# Patient Record
Sex: Female | Born: 1940 | Race: Black or African American | Hispanic: No | Marital: Married | Smoking: Never smoker
Health system: Southern US, Community
[De-identification: ages and names within clinical notes are randomized; demographics above are authoritative.]

---

## 2019-11-02 ENCOUNTER — Encounter: Payer: Self-pay | Admitting: Endocrinology

## 2020-03-17 ENCOUNTER — Encounter: Payer: Self-pay | Admitting: Endocrinology

## 2020-03-20 ENCOUNTER — Other Ambulatory Visit: Payer: Self-pay

## 2020-04-11 ENCOUNTER — Encounter: Payer: Self-pay | Admitting: Endocrinology

## 2020-04-11 ENCOUNTER — Other Ambulatory Visit: Payer: Self-pay

## 2020-04-11 ENCOUNTER — Ambulatory Visit (INDEPENDENT_AMBULATORY_CARE_PROVIDER_SITE_OTHER): Payer: Medicare Other | Admitting: Endocrinology

## 2020-04-11 VITALS — BP 142/80 | HR 79 | Ht <= 58 in | Wt 234.8 lb

## 2020-04-11 DIAGNOSIS — E669 Obesity, unspecified: Secondary | ICD-10-CM | POA: Diagnosis not present

## 2020-04-11 DIAGNOSIS — E1169 Type 2 diabetes mellitus with other specified complication: Secondary | ICD-10-CM | POA: Insufficient documentation

## 2020-04-11 DIAGNOSIS — E0590 Thyrotoxicosis, unspecified without thyrotoxic crisis or storm: Secondary | ICD-10-CM | POA: Diagnosis not present

## 2020-04-11 DIAGNOSIS — E042 Nontoxic multinodular goiter: Secondary | ICD-10-CM

## 2020-04-11 LAB — T3, FREE: T3, Free: 2.7 pg/mL (ref 2.3–4.2)

## 2020-04-11 LAB — T4, FREE: Free T4: 0.72 ng/dL (ref 0.60–1.60)

## 2020-04-11 LAB — GLUCOSE, RANDOM: Glucose, Bld: 102 mg/dL — ABNORMAL HIGH (ref 70–99)

## 2020-04-11 LAB — HEMOGLOBIN A1C: Hgb A1c MFr Bld: 6.5 % (ref 4.6–6.5)

## 2020-04-11 LAB — TSH: TSH: 2.95 u[IU]/mL (ref 0.35–4.50)

## 2020-04-11 NOTE — Progress Notes (Signed)
Patient ID: Erica [name], female   DOB: Jan 07, 1941, 79 y.o.   MRN: [id number]                                                                                                              Reason for Appointment:  Hyperthyroidism, new consultation  Referring healthcare provider: Malena Edman, NP   Chief complaint: Tiredness   History of Present Illness:   She says she has been diagnosed to have an overactive thyroid over 10 years ago Initially she had symptoms of palpitations, feeling moody, feeling excessively warm and sweaty, and fatigue.  The patient had lost unknown amount of weight also  He has been treated with methimazole in variable doses, presumably over 20 mg at 1 time For some time he has been taking only 5 mg daily She was followed by an endocrinologist at [facility] and was last seen there in 04/2019 and at that time was taking 10 mg methimazole  Apparently at that time her TSH had gone up to 10.1 and she was told to reduce the dose to 5 mg but did not have regular follow-up subsequently until she was seen by her PCP earlier this month  Currently she does feel tired and this may be somewhat worse in the last 3 months No further complaints of palpitations She thinks she has lost some weight recently, maximum weight 252 pounds Does not feel hot, tends to feel more cold but not consistently No dry skin or unusual constipation She does not feel drowsy or have difficulty concentrating   Wt Readings from Last 3 Encounters:  04/11/20 234 lb 12.8 oz (106.5 kg)    Thyroid function tests as follows:     TSH done on 03/17/2020 was 4.65, free T4 0.85 and total T3 98  No results found for: FREET4, T3FREE, TSH  No results found for: THYROTRECAB   Previous thyrotropin receptor antibody done in 11/2017 was 19.6 through [facility]  Evaluation done at [facility] in 2019:  Thyroid nodules: thyroid US confirmed the presence of 3 very large nodules (2xRt and 1xLT) which all meet criteria for  Bx. NM RIU with >60% uptake and cold nodules. FNA with benign results   Allergies as of 04/11/2020   No Known Allergies     Medication List       Accurate as of April 11, 2020  3:49 PM. If you have any questions, ask your nurse or doctor.        albuterol 108 (90 Base) MCG/ACT inhaler Commonly known as: VENTOLIN HFA Inhale 2 puffs into the lungs 4 (four) times daily as needed for shortness of breath. Inhale up to 4 times daily as needed.   albuterol (2.5 MG/3ML) 0.083% nebulizer solution Commonly known as: PROVENTIL Inhale 3 mLs into the lungs 3 (three) times daily as needed for shortness of breath. Inhale 23mL via nebulizer up to three times daily as needed.   aspirin 81 MG EC tablet Take 1 tablet by mouth daily.   diclofenac Sodium 1 % Gel Commonly known as:  VOLTAREN Apply 1 application topically 4 (four) times daily as needed for pain. Apply one application topically up to four times daily for knee pain.   docusate sodium 100 MG capsule Commonly known as: COLACE Take 100 mg by mouth daily.   dorzolamide 2 % ophthalmic solution Commonly known as: TRUSOPT Place 1 drop into the right eye 2 (two) times daily. Instill 1 drop in right eye 2 times daily.   latanoprost 0.005 % ophthalmic solution Commonly known as: XALATAN Place 1 drop into both eyes at bedtime.   Magnesium Oxide 250 MG Tabs Take 250 mg by mouth daily.   metFORMIN 1000 MG tablet Commonly known as: GLUCOPHAGE Take 1,000 mg by mouth daily.   methimazole 5 MG tablet Commonly known as: TAPAZOLE Take 5 mg by mouth daily.   metoprolol succinate 25 MG 24 hr tablet Commonly known as: TOPROL-XL Take 25 mg by mouth daily.   triamterene-hydrochlorothiazide 37.5-25 MG tablet Commonly known as: MAXZIDE-25 Take 1 tablet by mouth daily.           History reviewed. No pertinent past medical history.  History reviewed. No pertinent surgical history.  Family History  Problem Relation Age of Onset  .  Diabetes Father   . Diabetes Sister   . Diabetes Brother   . Thyroid disease Neg Hx     Social History:  has no history on file for tobacco, alcohol, and drug.  Allergies: No Known Allergies   Review of Systems  Constitutional: Positive for weight loss and malaise.  HENT:       She thinks occasionally her voice gets deep but not necessarily hoarse  Eyes: Negative for blurred vision.  Respiratory: Negative for shortness of breath.   Cardiovascular: Negative for palpitations and leg swelling.  Gastrointestinal: Positive for constipation.  Endocrine: Positive for cold intolerance.       Last A1c 6.4 in 11/20, has had diabetes for 15-20 years currently on Metformin only.  Home blood sugars about 120 checked randomly  Genitourinary: Negative for dysuria.  Musculoskeletal: Positive for joint pain.  Skin: Negative for dry skin.  Neurological: Positive for tingling.       Examination:   BP (!) 142/80 (BP Location: Left Arm, Patient Position: Sitting, Cuff Size: Normal)   Pulse 79   Ht 1' (0.305 m)   Wt 234 lb 12.8 oz (106.5 kg)   SpO2 98%   BMI 1146.41 kg/m    General Appearance:  well-built and nourished, pleasant, not anxious or hyperkinetic.        Eyes: No abnormal prominence, lid lag or stare present.  No swelling of the eyelids   Neck: The thyroid is enlarged at least 3 times normal, larger on the left Left lobe is firmer about 3-3-1/2 times normal and nodular Right lobe is better felt on swallowing and is about 3 times normal No significant isthmus enlargement  There is no lymphadenopathy in the neck .           Heart: normal S1 and S2, no murmurs .          Lungs: breath sounds are normal bilaterally without added sounds  Abdomen: no hepatosplenomegaly or other palpable abnormality   Extremities:  Normal appearance, hands not diaphoretic or unusually warm No ankle edema.  Neurological:  No fine tremors are present. Deep tendon reflexes at biceps are  slightly brisk.  Skin: No rash, abnormal thickening of the skin on the lower legs seen     Assessment/Plan:  Hyperthyroidism, apparently Graves' disease as confirmed by high thyrotropin receptor antibody in 2018 She has had hypothyroidism for over 10 years apparently  She also has a multinodular goiter on exam  Apparently she required lower doses of methimazole at least for the last year and now only on 5 mg daily She has some nonspecific fatigue and weight loss Most recent TSH was high at 4.7  This likely indicates that she may be in remission from the Graves' disease and may not need further antithyroid drugs   Discussed with the patient the hyperthyroidism is a result of an autoimmune condition affecting the thyroid cells although she may have a component of autonomous or overactive thyroid nodules also  She does have a significant multinodular goiter on exam This has been also evaluated with needle aspiration biopsies for the cold nodules and is benign  Explained that the options for treatment are either antithyroid drugs and radioactive iodine. Radioactive iodine would be preferred if she continues to have hyperthyroidism without increased thyrotropin receptor antibody  Most likely will need to stop her methimazole but will recheck her thyroid levels again today since she may have missed a few doses prior to her last labs  Patient handout on hyperthyroidism and radioactive iodine treatment given Patient understands the above discussion and treatment options. 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 Any transcriptional errors that result from this process are unintentional.

## 2020-04-12 ENCOUNTER — Other Ambulatory Visit: Payer: Self-pay | Admitting: Endocrinology

## 2020-04-12 DIAGNOSIS — E0590 Thyrotoxicosis, unspecified without thyrotoxic crisis or storm: Secondary | ICD-10-CM

## 2020-04-12 LAB — THYROTROPIN RECEPTOR AUTOABS: Thyrotropin Receptor Ab: 21.7 IU/L — ABNORMAL HIGH (ref 0.00–1.75)

## 2020-04-12 NOTE — Progress Notes (Signed)
Thyroid level is normal but antibody test for Graves' disease is high.  Instead of continuing methimazole will need to do radioactive iodine treatment.  She will first need to have a test dose which will take 2 separate trips.  She will stop methimazole 7 days before the test.  Does she want to do this in [place]?

## 2020-04-27 ENCOUNTER — Encounter (HOSPITAL_COMMUNITY)
Admission: RE | Admit: 2020-04-27 | Discharge: 2020-04-27 | Disposition: A | Discharge status: Home / Self Care | Payer: Medicare Other | Source: Ambulatory Visit | Attending: Endocrinology | Admitting: Endocrinology

## 2020-04-27 ENCOUNTER — Other Ambulatory Visit: Payer: Self-pay

## 2020-04-27 DIAGNOSIS — E0590 Thyrotoxicosis, unspecified without thyrotoxic crisis or storm: Secondary | ICD-10-CM

## 2020-04-27 MED ORDER — SODIUM IODIDE I-123 7.4 MBQ CAPS
464.0000 | ORAL_CAPSULE | Freq: Once | ORAL | Status: AC
  Administered 2020-04-27: 464 via ORAL

## 2020-04-28 ENCOUNTER — Encounter (HOSPITAL_COMMUNITY)
Admission: RE | Admit: 2020-04-28 | Discharge: 2020-04-28 | Disposition: A | Discharge status: Home / Self Care | Payer: Medicare Other | Source: Ambulatory Visit | Attending: Endocrinology | Admitting: Endocrinology

## 2020-05-01 ENCOUNTER — Other Ambulatory Visit: Payer: Self-pay | Admitting: Endocrinology

## 2020-05-01 DIAGNOSIS — E0590 Thyrotoxicosis, unspecified without thyrotoxic crisis or storm: Secondary | ICD-10-CM

## 2020-05-03 NOTE — Progress Notes (Signed)
Scheduled for 05-26-20 at 1 but Dr Lucianne Muss wants it earlier.   I called NM and she is going to call me back with a earlier appt.

## 2020-05-17 ENCOUNTER — Ambulatory Visit: Payer: Medicare Other | Admitting: Endocrinology

## 2020-05-19 ENCOUNTER — Encounter (HOSPITAL_COMMUNITY): Admission: RE | Admit: 2020-05-19 | Payer: Medicare Other | Source: Ambulatory Visit

## 2020-05-26 ENCOUNTER — Ambulatory Visit (HOSPITAL_COMMUNITY): Payer: Medicare Other

## 2020-05-26 ENCOUNTER — Other Ambulatory Visit: Payer: Self-pay

## 2020-05-26 ENCOUNTER — Ambulatory Visit (HOSPITAL_COMMUNITY)
Admission: RE | Admit: 2020-05-26 | Discharge: 2020-05-26 | Disposition: A | Discharge status: Home / Self Care | Payer: Medicare Other | Source: Ambulatory Visit | Attending: Endocrinology | Admitting: Endocrinology

## 2020-05-26 DIAGNOSIS — E0590 Thyrotoxicosis, unspecified without thyrotoxic crisis or storm: Secondary | ICD-10-CM | POA: Diagnosis not present

## 2020-05-26 MED ORDER — SODIUM IODIDE I 131 CAPSULE
20.7000 | Freq: Once | INTRAVENOUS | Status: AC | PRN
  Administered 2020-05-26: 20.7 via ORAL

## 2020-06-15 ENCOUNTER — Ambulatory Visit (INDEPENDENT_AMBULATORY_CARE_PROVIDER_SITE_OTHER): Payer: Medicare Other | Admitting: Endocrinology

## 2020-06-15 ENCOUNTER — Other Ambulatory Visit: Payer: Self-pay

## 2020-06-15 VITALS — BP 122/72 | HR 84 | Ht 65.0 in | Wt 234.2 lb

## 2020-06-15 DIAGNOSIS — E669 Obesity, unspecified: Secondary | ICD-10-CM

## 2020-06-15 DIAGNOSIS — E042 Nontoxic multinodular goiter: Secondary | ICD-10-CM | POA: Diagnosis not present

## 2020-06-15 DIAGNOSIS — E1169 Type 2 diabetes mellitus with other specified complication: Secondary | ICD-10-CM | POA: Diagnosis not present

## 2020-06-15 LAB — T4, FREE: Free T4: 0.72 ng/dL (ref 0.60–1.60)

## 2020-06-15 LAB — GLUCOSE, RANDOM: Glucose, Bld: 88 mg/dL (ref 70–99)

## 2020-06-15 LAB — TSH: TSH: 4.96 u[IU]/mL — ABNORMAL HIGH (ref 0.35–4.50)

## 2020-06-15 LAB — GLUCOSE, POCT (MANUAL RESULT ENTRY): POC Glucose: 80 mg/dl (ref 70–99)

## 2020-06-15 NOTE — Patient Instructions (Signed)
Stop Mehimazole

## 2020-06-15 NOTE — Progress Notes (Signed)
Patient ID: Erica [name], female   DOB: [birth year], 79 y.o.   MRN: [id number]                                                                                                              Reason for Appointment:  Hyperthyroidism, follow-up visit  Referring healthcare provider: Malena Edman, NP   Chief complaint: Follow-up of thyroid   History of Present Illness:   Background history: She says she has been diagnosed to have an overactive thyroid over 10 years ago Initially she had symptoms of palpitations, feeling moody, feeling excessively warm and sweaty, and fatigue.  The patient had lost unknown amount of weight also  He has been treated with methimazole in variable doses, presumably over 20 mg at 1 time For some time he has been taking only 5 mg daily She was followed by an endocrinologist at [facility] and was last seen there in 04/2019 and at that time was taking 10 mg methimazole  Apparently at that time her TSH had gone up to 10.1 and she was told to reduce the dose to 5 mg but did not have regular follow-up subsequently until she was seen by her PCP   RECENT HISTORY:  On her initial consultation she felt that she may have lost some weight and was having persistent tiredness She did not have any other symptoms suggestive of hyperthyroidism  Her thyroid levels in 4/21 were normal but her thyrotropin receptor antibody was 21.7  She was treated with radioactive iodine on 05/26/2020 with 20.7 mCi I-131 sodium iodide  She thinks swelling in her neck is less prominent she is not tired all the time She sometimes feels hot but also other times feels cold She is still on methimazole 5 mg daily  Wt Readings from Last 3 Encounters:  06/15/20 234 lb 3.2 oz (106.2 kg)  04/11/20 234 lb 12.8 oz (106.5 kg)    Thyroid function tests as follows:     TSH done on 03/17/2020 was 4.65, free T4 = 0.85 and total T3 = 98  Lab Results  Component Value Date   FREET4 0.72 04/11/2020   T3FREE  2.7 04/11/2020   TSH 2.95 04/11/2020    Lab Results  Component Value Date   THYROTRECAB 21.70 (H) 04/11/2020     Previous thyrotropin receptor antibody done in 11/2017 was 19.6 through [facility]  Evaluation done at [facility] in 2019:  Thyroid nodules: thyroid US confirmed the presence of 3 very large nodules (2xRt and 1xLT) which all meet criteria for Bx. NM RIU with >60% uptake and cold nodules. FNA with benign results   Allergies as of 06/15/2020   No Known Allergies     Medication List       Accurate as of June 15, 2020  2:13 PM. If you have any questions, ask your nurse or doctor.        albuterol 108 (90 Base) MCG/ACT inhaler Commonly known as: VENTOLIN HFA Inhale 2 puffs into the lungs 4 (four) times daily as needed for shortness  of breath. Inhale up to 4 times daily as needed.   albuterol (2.5 MG/3ML) 0.083% nebulizer solution Commonly known as: PROVENTIL Inhale 3 mLs into the lungs 3 (three) times daily as needed for shortness of breath. Inhale 93mL via nebulizer up to three times daily as needed.   aspirin 81 MG EC tablet Take 1 tablet by mouth daily.   diclofenac Sodium 1 % Gel Commonly known as: VOLTAREN Apply 1 application topically 4 (four) times daily as needed for pain. Apply one application topically up to four times daily for knee pain.   docusate sodium 100 MG capsule Commonly known as: COLACE Take 100 mg by mouth daily.   dorzolamide 2 % ophthalmic solution Commonly known as: TRUSOPT Place 1 drop into the right eye 2 (two) times daily. Instill 1 drop in right eye 2 times daily.   latanoprost 0.005 % ophthalmic solution Commonly known as: XALATAN Place 1 drop into both eyes at bedtime.   Magnesium Oxide 250 MG Tabs Take 250 mg by mouth daily.   metFORMIN 1000 MG tablet Commonly known as: GLUCOPHAGE Take 1,000 mg by mouth daily.   methimazole 5 MG tablet Commonly known as: TAPAZOLE Take 5 mg by mouth daily.   metoprolol succinate 25 MG 24 hr  tablet Commonly known as: TOPROL-XL Take 25 mg by mouth daily.   triamterene-hydrochlorothiazide 37.5-25 MG tablet Commonly known as: MAXZIDE-25 Take 1 tablet by mouth daily.           No past medical history on file.  No past surgical history on file.  Family History  Problem Relation Age of Onset  . Diabetes Father   . Diabetes Sister   . Diabetes Brother   . Thyroid disease Neg Hx     Social History:  has no history on file for tobacco use, alcohol use, and drug use.  Allergies: No Known Allergies   Review of Systems  DIABETES: She has mild diabetes and she thinks her blood sugars are near normal at home in the morning  Lab Results  Component Value Date   HGBA1C 6.5 04/11/2020   No results found for: GLUF, MICROALBUR, LDLCALC, CREATININE    Examination:   BP 122/72 (BP Location: Left Arm, Patient Position: Sitting)   Pulse 84   Wt 234 lb 3.2 oz (106.2 kg)   BMI 1143.48 kg/m   THYROID exam: Thyroid is enlarged bilaterally, firm and smooth, about twice normal and slightly more prominent on the left  Neurological:  No fine tremors are present. Deep tendon reflexes at biceps are slightly brisk.  No pedal edema   Assessment/Plan:   Hyperthyroidism, 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 in 2018  She also has a multinodular goiter on exam  She was treated with I-131 and is continued on methimazole 5 mg daily Subjectively she is doing fairly well and feels less tired  On exam her goiter is significantly smaller Although her reflexes are brisk she looks euthyroid Thyroid levels will be checked today  Since most likely with her thyroid size regressing she has had [name] results from the I-131 she can stop methimazole for now  Discussed potential for hypothyroidism by the end of next month and she will come back in about 6 weeks for follow-up again   Reather Littler 06/15/2020, 2:13 PM    Note: This office note was prepared  with Dragon voice recognition system technology. Any transcriptional errors that result from this process are unintentional.

## 2020-06-15 NOTE — Addendum Note (Signed)
Addended by: Iris Pert D on: 06/15/2020 04:11 PM   Modules accepted: Orders

## 2020-06-20 ENCOUNTER — Telehealth: Payer: Self-pay | Admitting: Endocrinology

## 2020-06-20 NOTE — Telephone Encounter (Signed)
Pt called a second time stating she was asked to call this number by Anette Riedel - saw that he did attempt to call her 2x. Gave her the lab results she understood and will stop the methimazole, pt is already set up for a visit next month

## 2020-06-20 NOTE — Telephone Encounter (Signed)
Patient states she is returning Noah's call and requests to be called at ph# [phone].

## 2020-08-02 ENCOUNTER — Encounter: Payer: Self-pay | Admitting: Endocrinology

## 2020-08-02 ENCOUNTER — Ambulatory Visit (INDEPENDENT_AMBULATORY_CARE_PROVIDER_SITE_OTHER): Payer: Medicare Other | Admitting: Endocrinology

## 2020-08-02 ENCOUNTER — Other Ambulatory Visit (INDEPENDENT_AMBULATORY_CARE_PROVIDER_SITE_OTHER): Payer: Medicare Other

## 2020-08-02 ENCOUNTER — Other Ambulatory Visit: Payer: Self-pay

## 2020-08-02 VITALS — BP 140/72 | HR 76 | Ht 65.0 in | Wt 236.8 lb

## 2020-08-02 DIAGNOSIS — E669 Obesity, unspecified: Secondary | ICD-10-CM

## 2020-08-02 DIAGNOSIS — E1169 Type 2 diabetes mellitus with other specified complication: Secondary | ICD-10-CM

## 2020-08-02 DIAGNOSIS — E042 Nontoxic multinodular goiter: Secondary | ICD-10-CM | POA: Diagnosis not present

## 2020-08-02 LAB — POCT GLUCOSE (DEVICE FOR HOME USE): Glucose Fasting, POC: 85 mg/dL (ref 70–99)

## 2020-08-02 LAB — T4, FREE: Free T4: 0.77 ng/dL (ref 0.60–1.60)

## 2020-08-02 LAB — T3, FREE: T3, Free: 3.3 pg/mL (ref 2.3–4.2)

## 2020-08-02 LAB — POCT GLYCOSYLATED HEMOGLOBIN (HGB A1C): Hemoglobin A1C: 6.2 % — AB (ref 4.0–5.6)

## 2020-08-02 LAB — TSH: TSH: 1.48 u[IU]/mL (ref 0.35–4.50)

## 2020-08-02 NOTE — Progress Notes (Signed)
Patient ID: Erica [name], female   DOB: 14-May-1941, 79 y.o.   MRN: [id number]                                                                                                              Reason for Appointment:  Hyperthyroidism, follow-up visit  Referring healthcare provider: Malena Edman, NP   Chief complaint: Follow-up of thyroid   History of Present Illness:   Background history: She says she has been diagnosed to have an overactive thyroid over 10 years ago Initially she had symptoms of palpitations, feeling moody, feeling excessively warm and sweaty, and fatigue.  The patient had lost unknown amount of weight also  He has been treated with methimazole in variable doses, presumably over 20 mg at 1 time For some time he has been taking only 5 mg daily She was followed by an endocrinologist at [facility] and was last seen there in 04/2019 and at that time was taking 10 mg methimazole  Apparently at that time her TSH had gone up to 10.1 and she was told to reduce the dose to 5 mg but did not have regular follow-up subsequently until she was seen by her PCP   RECENT HISTORY:  On her initial consultation she felt that she may have lost some weight and was having persistent tiredness She did not have any other symptoms suggestive of hyperthyroidism  Her thyroid levels in 4/21 were normal but her thyrotropin receptor antibody was 21.7  She was treated with radioactive iodine on 05/26/2020 with 20.7 mCi I-131 sodium iodide  Subsequently she has had less swelling of her neck and goiter was monitored She also has been subjectively feeling less tired Methimazole was stopped in 7/21 when her TSH was high  She sometimes feels hot but has had no significant weight change, palpitations or fatigue Labs pending  Wt Readings from Last 3 Encounters:  08/02/20 236 lb 12.8 oz (107.4 kg)  06/15/20 234 lb 3.2 oz (106.2 kg)  04/11/20 234 lb 12.8 oz (106.5 kg)    Thyroid function tests as  follows:     TSH done on 03/17/2020 was 4.65, free T4 = 0.85 and total T3 = 98  Lab Results  Component Value Date   FREET4 0.72 06/15/2020   FREET4 0.72 04/11/2020   T3FREE 2.7 04/11/2020   TSH 4.96 (H) 06/15/2020   TSH 2.95 04/11/2020    Lab Results  Component Value Date   THYROTRECAB 21.70 (H) 04/11/2020     Previous thyrotropin receptor antibody done in 11/2017 was 19.6 through [facility]  Evaluation done at [facility] in 2019:  Thyroid nodules: thyroid US confirmed the presence of 3 very large nodules (2xRt and 1xLT) which all meet criteria for Bx. NM RIU with >60% uptake and cold nodules. FNA with benign results   Allergies as of 08/02/2020   No Known Allergies     Medication List       Accurate as of August 02, 2020  9:18 AM. If you have any questions, ask your  nurse or doctor.        albuterol 108 (90 Base) MCG/ACT inhaler Commonly known as: VENTOLIN HFA Inhale 2 puffs into the lungs 4 (four) times daily as needed for shortness of breath. Inhale up to 4 times daily as needed.   albuterol (2.5 MG/3ML) 0.083% nebulizer solution Commonly known as: PROVENTIL Inhale 3 mLs into the lungs 3 (three) times daily as needed for shortness of breath. Inhale 53mL via nebulizer up to three times daily as needed.   aspirin 81 MG EC tablet Take 1 tablet by mouth daily.   diclofenac Sodium 1 % Gel Commonly known as: VOLTAREN Apply 1 application topically 4 (four) times daily as needed for pain. Apply one application topically up to four times daily for knee pain.   docusate sodium 100 MG capsule Commonly known as: COLACE Take 100 mg by mouth daily.   dorzolamide 2 % ophthalmic solution Commonly known as: TRUSOPT Place 1 drop into the right eye 2 (two) times daily. Instill 1 drop in right eye 2 times daily.   latanoprost 0.005 % ophthalmic solution Commonly known as: XALATAN Place 1 drop into both eyes at bedtime.   Magnesium Oxide 250 MG Tabs Take 250 mg by mouth daily.     metFORMIN 1000 MG tablet Commonly known as: GLUCOPHAGE Take 1,000 mg by mouth daily.   methimazole 5 MG tablet Commonly known as: TAPAZOLE Take 5 mg by mouth daily.   metoprolol succinate 25 MG 24 hr tablet Commonly known as: TOPROL-XL Take 25 mg by mouth daily.   triamterene-hydrochlorothiazide 37.5-25 MG tablet Commonly known as: MAXZIDE-25 Take 1 tablet by mouth daily.           No past medical history on file.  No past surgical history on file.  Family History  Problem Relation Age of Onset  . Diabetes Father   . Diabetes Sister   . Diabetes Brother   . Thyroid disease Neg Hx     Social History:  reports that she has never smoked. She has never used smokeless tobacco. She reports previous alcohol use. No history on file for drug use.  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 Usually checks home blood sugars in the morning only, office glucose 85 today fasting  Lab Results  Component Value Date   HGBA1C 6.2 (A) 08/02/2020   HGBA1C 6.5 04/11/2020   No results found for: GLUF, MICROALBUR, LDLCALC, CREATININE    Examination:   BP 140/72 (BP Location: Left Arm, Patient Position: Sitting, Cuff Size: Large)   Pulse 76   Ht 5\' 5"  (1.651 m)   Wt 236 lb 12.8 oz (107.4 kg)   SpO2 99%   BMI 39.41 kg/m   THYROID exam: Thyroid is palpable bilaterally Right lobe best felt on swallowing, diffuse and slightly firm, about 1-1/2 times normal. Left side is also palpable mostly on swallowing, more lateral and about 1-1/2 times normal No distinct nodule felt Biceps reflexes appear normal  Skin appears normal    Assessment/Plan:   Hyperthyroidism, 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 in 2018  She also has a multinodular goiter on exam  She was treated with I-131 and currently is off methimazole She is subjectively doing well Thyroid enlargement has  improved progressively   2019 08/02/2020, 9:18 AM    Note: This office note was prepared with Dragon voice recognition system technology. Any transcriptional errors that  result from this process are unintentional.  Addendum: Labs are normal, no medications needed, also follow-up in 6 weeks

## 2020-08-07 ENCOUNTER — Encounter: Payer: Self-pay | Admitting: *Deleted

## 2020-08-07 NOTE — Progress Notes (Signed)
Letter sent to patient with lab results.

## 2020-09-15 ENCOUNTER — Ambulatory Visit: Payer: Medicare Other | Admitting: Endocrinology

## 2020-09-20 ENCOUNTER — Encounter: Payer: Self-pay | Admitting: Endocrinology

## 2020-09-20 ENCOUNTER — Ambulatory Visit (INDEPENDENT_AMBULATORY_CARE_PROVIDER_SITE_OTHER): Payer: Medicare Other | Admitting: Endocrinology

## 2020-09-20 ENCOUNTER — Other Ambulatory Visit: Payer: Self-pay

## 2020-09-20 VITALS — BP 134/80 | HR 97 | Ht 65.0 in | Wt 223.0 lb

## 2020-09-20 DIAGNOSIS — E669 Obesity, unspecified: Secondary | ICD-10-CM

## 2020-09-20 DIAGNOSIS — E1169 Type 2 diabetes mellitus with other specified complication: Secondary | ICD-10-CM

## 2020-09-20 DIAGNOSIS — E042 Nontoxic multinodular goiter: Secondary | ICD-10-CM

## 2020-09-20 LAB — T4, FREE: Free T4: 2.11 ng/dL — ABNORMAL HIGH (ref 0.60–1.60)

## 2020-09-20 LAB — TSH: TSH: 0.01 u[IU]/mL — ABNORMAL LOW (ref 0.35–4.50)

## 2020-09-20 MED ORDER — METHIMAZOLE 5 MG PO TABS: 5.0000 mg | ORAL_TABLET | Freq: Two times a day (BID) | ORAL | 1 refills | Status: DC

## 2020-09-20 NOTE — Addendum Note (Signed)
Addended by: Reather Littler on: 09/20/2020 08:57 PM   Modules accepted: Orders

## 2020-09-20 NOTE — Progress Notes (Addendum)
Patient ID: Erica [name], female   DOB: [birth year], 79 y.o.   MRN: [id number]                                                                                                              Reason for Appointment:  Hyperthyroidism, follow-up visit  Chief complaint: Follow-up of thyroid   History of Present Illness:   Background history: She says she has been diagnosed to have an overactive thyroid over 10 years ago Initially she had symptoms of palpitations, feeling moody, feeling excessively warm and sweaty, and fatigue.  The patient had lost unknown amount of weight also  He has been treated with methimazole in variable doses, presumably over 20 mg at 1 time For some time he has been taking only 5 mg daily She was followed by an endocrinologist at [facility] and was last seen there in 04/2019 and at that time was taking 10 mg methimazole  Apparently at that time her TSH had gone up to 10.1 and she was told to reduce the dose to 5 mg but did not have regular follow-up subsequently until she was seen by her PCP   RECENT HISTORY:  On her initial consultation she felt that she may have lost some weight and was having persistent tiredness She did not have any other symptoms suggestive of hyperthyroidism  Her thyroid levels in 4/21 were normal but her thyrotropin receptor antibody was 21.7  She was treated with radioactive iodine on 05/26/2020 with 20.7 mCi I-131 sodium iodide  Subsequently she has had less fatigue on her thyroid enlargement has regressed  Methimazole was stopped in 7/21 when her TSH was high  She now may sometimes feeling cold compared to previously feeling hot No palpitations She thinks she is losing weight from cutting back on portions  Labs pending  Wt Readings from Last 3 Encounters:  09/20/20 223 lb (101.2 kg)  08/02/20 236 lb 12.8 oz (107.4 kg)  06/15/20 234 lb 3.2 oz (106.2 kg)    Thyroid function tests as follows:     TSH done on 03/17/2020 was 4.65,  free T4 = 0.85 and total T3 = 98  Lab Results  Component Value Date   FREET4 0.77 08/02/2020   FREET4 0.72 06/15/2020   FREET4 0.72 04/11/2020   T3FREE 3.3 08/02/2020   T3FREE 2.7 04/11/2020   TSH 1.48 08/02/2020   TSH 4.96 (H) 06/15/2020   TSH 2.95 04/11/2020    Lab Results  Component Value Date   THYROTRECAB 21.70 (H) 04/11/2020    24 hour radio iodine uptake calculated at 51%  Previous thyrotropin receptor antibody done in 11/2017 was 19.6 through [facility]  Evaluation done at [facility] in 2019:  Thyroid nodules: thyroid US confirmed the presence of 3 very large nodules (2xRt and 1xLT) which all meet criteria for Bx. NM RIU with >60% uptake and cold nodules. FNA with benign results   Allergies as of 09/20/2020   No Known Allergies     Medication List  Accurate as of September 20, 2020 10:22 AM. If you have any questions, ask your nurse or doctor.        albuterol 108 (90 Base) MCG/ACT inhaler Commonly known as: VENTOLIN HFA Inhale 2 puffs into the lungs 4 (four) times daily as needed for shortness of breath. Inhale up to 4 times daily as needed.   albuterol (2.5 MG/3ML) 0.083% nebulizer solution Commonly known as: PROVENTIL Inhale 3 mLs into the lungs 3 (three) times daily as needed for shortness of breath. Inhale 10mL via nebulizer up to three times daily as needed.   aspirin 81 MG EC tablet Take 1 tablet by mouth daily.   diclofenac Sodium 1 % Gel Commonly known as: VOLTAREN Apply 1 application topically 4 (four) times daily as needed for pain. Apply one application topically up to four times daily for knee pain.   docusate sodium 100 MG capsule Commonly known as: COLACE Take 100 mg by mouth daily.   dorzolamide 2 % ophthalmic solution Commonly known as: TRUSOPT Place 1 drop into the right eye 2 (two) times daily. Instill 1 drop in right eye 2 times daily.   latanoprost 0.005 % ophthalmic solution Commonly known as: XALATAN Place 1 drop into both eyes at  bedtime.   Magnesium Oxide 250 MG Tabs Take 250 mg by mouth daily.   metFORMIN 1000 MG tablet Commonly known as: GLUCOPHAGE Take 1,000 mg by mouth daily.   methimazole 5 MG tablet Commonly known as: TAPAZOLE Take 5 mg by mouth daily.   metoprolol succinate 25 MG 24 hr tablet Commonly known as: TOPROL-XL Take 25 mg by mouth daily.   triamterene-hydrochlorothiazide 37.5-25 MG tablet Commonly known as: MAXZIDE-25 Take 1 tablet by mouth daily.           No past medical history on file.  No past surgical history on file.  Family History  Problem Relation Age of Onset  . Diabetes Father   . Diabetes Sister   . Diabetes Brother   . Thyroid disease Neg Hx     Social History:  reports that she has never smoked. She has never used smokeless tobacco. She reports previous alcohol use. No history on file for drug use.  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   She also has a multinodular goiter on exam  She was treated with I-131 in 05/2020 Subsequently has been off methimazole She is not having any new symptoms although may be having cold  intolerance Weight has come down  Thyroid enlargement has regressed  We will check her labs and decide  on further management Discussed possibility of hypothyroidism post I-131, possible symptoms with this and this should be treated with thyroid supplementation long-term If her thyroid levels are consistently normal she can follow-up with her PCP regularly  Diabetes: Advised her to check some readings after meals instead of in the morning only To have A1c done with new PCP and follow-up  Reather Littler 09/20/2020, 10:22 AM    Note: This office note was prepared with Dragon voice recognition system technology. Any transcriptional errors that result from this process are unintentional.  Addendum: The thyroid level has gone up high again with free T4 2.1.  For now will restart methimazole 5 mg twice a day.  To schedule follow-up in 1 month again with same-day labs.

## 2020-09-22 ENCOUNTER — Other Ambulatory Visit: Payer: Self-pay | Admitting: *Deleted

## 2020-09-22 MED ORDER — METHIMAZOLE 5 MG PO TABS
5.0000 mg | ORAL_TABLET | Freq: Two times a day (BID) | ORAL | 1 refills | Status: DC
Start: 2020-09-22 — End: 2020-10-25

## 2020-10-23 ENCOUNTER — Encounter: Payer: Self-pay | Admitting: Endocrinology

## 2020-10-23 ENCOUNTER — Other Ambulatory Visit: Payer: Self-pay

## 2020-10-23 ENCOUNTER — Ambulatory Visit (INDEPENDENT_AMBULATORY_CARE_PROVIDER_SITE_OTHER): Payer: Medicare Other | Admitting: Endocrinology

## 2020-10-23 VITALS — BP 130/84 | HR 90 | Ht 65.0 in | Wt 216.5 lb

## 2020-10-23 DIAGNOSIS — E0590 Thyrotoxicosis, unspecified without thyrotoxic crisis or storm: Secondary | ICD-10-CM | POA: Diagnosis not present

## 2020-10-23 LAB — TSH: TSH: <0.01 u[IU]/mL — ABNORMAL LOW (ref 0.35–4.50)

## 2020-10-23 LAB — T3, FREE: T3, Free: 4.8 pg/mL — ABNORMAL HIGH (ref 2.3–4.2)

## 2020-10-23 LAB — T4, FREE: Free T4: 1.97 ng/dL — ABNORMAL HIGH (ref 0.60–1.60)

## 2020-10-23 NOTE — Progress Notes (Addendum)
Patient ID: Erica [name], female   DOB: June 20, 1941, 79 y.o.   MRN: [id number]                                                                                                              Reason for Appointment:  Hyperthyroidism, follow-up visit  Chief complaint: Follow-up of thyroid   History of Present Illness:   Background history: She says she has been diagnosed to have an overactive thyroid over 10 years ago Initially she had symptoms of palpitations, feeling moody, feeling excessively warm and sweaty, and fatigue.  The patient had lost unknown amount of weight also  He has been treated with methimazole in variable doses, presumably over 20 mg at 1 time For some time he has been taking only 5 mg daily She was followed by an endocrinologist at [facility] and was last seen there in 04/2019 and at that time was taking 10 mg methimazole  Apparently at that time her TSH had gone up to 10.1 and she was told to reduce the dose to 5 mg but did not have regular follow-up subsequently until she was seen by her PCP   RECENT HISTORY:  On her initial consultation she felt that she may have lost some weight and was having persistent tiredness She did not have any other symptoms suggestive of hyperthyroidism  Her thyroid levels in 4/21 were normal but her thyrotropin receptor antibody was 21.7  She was treated with radioactive iodine on 05/26/2020 with 20.7 mCi I-131 sodium iodide  Methimazole was stopped in 7/21 when her TSH was high  Since 8/21 she appears to be losing weight progressively She says her appetite is not as much as before At times will have some jitteriness and palpitations Some days she will feel cold and other days feels hot At times will get tired also  She has been started back on methimazole 5 mg twice daily since 10/21 but she did not feel any better  Labs pending  Wt Readings from Last 3 Encounters:  10/23/20 216 lb 8 oz (98.2 kg)  09/20/20 223 lb (101.2 kg)   08/02/20 236 lb 12.8 oz (107.4 kg)    Thyroid function tests as follows:     TSH done on 03/17/2020 was 4.65, free T4 = 0.85 and total T3 = 98  Lab Results  Component Value Date   FREET4 2.11 (H) 09/20/2020   FREET4 0.77 08/02/2020   FREET4 0.72 06/15/2020   T3FREE 3.3 08/02/2020   T3FREE 2.7 04/11/2020   TSH 0.01 (L) 09/20/2020   TSH 1.48 08/02/2020   TSH 4.96 (H) 06/15/2020    Lab Results  Component Value Date   THYROTRECAB 21.70 (H) 04/11/2020    04/2020:24 hour radio iodine uptake calculated at 51%  Previous thyrotropin receptor antibody done in 11/2017 was 19.6 through [facility]  Evaluation done at [facility] in 2019:  Thyroid nodules: thyroid US confirmed the presence of 3 very large nodules (2xRt and 1xLT) which all meet criteria for Bx. NM RIU with >60% uptake  and cold nodules. FNA with benign results   Allergies as of 10/23/2020   No Known Allergies     Medication List       Accurate as of October 23, 2020  8:40 AM. If you have any questions, ask your nurse or doctor.        albuterol 108 (90 Base) MCG/ACT inhaler Commonly known as: VENTOLIN HFA Inhale 2 puffs into the lungs 4 (four) times daily as needed for shortness of breath. Inhale up to 4 times daily as needed.   albuterol (2.5 MG/3ML) 0.083% nebulizer solution Commonly known as: PROVENTIL Inhale 3 mLs into the lungs 3 (three) times daily as needed for shortness of breath. Inhale 21mL via nebulizer up to three times daily as needed.   aspirin 81 MG EC tablet Take 1 tablet by mouth daily.   diclofenac Sodium 1 % Gel Commonly known as: VOLTAREN Apply 1 application topically 4 (four) times daily as needed for pain. Apply one application topically up to four times daily for knee pain.   docusate sodium 100 MG capsule Commonly known as: COLACE Take 100 mg by mouth daily.   dorzolamide 2 % ophthalmic solution Commonly known as: TRUSOPT Place 1 drop into the right eye 2 (two) times daily. Instill 1 drop in  right eye 2 times daily.   latanoprost 0.005 % ophthalmic solution Commonly known as: XALATAN Place 1 drop into both eyes at bedtime.   metFORMIN 1000 MG tablet Commonly known as: GLUCOPHAGE Take 1,000 mg by mouth daily.   methimazole 5 MG tablet Commonly known as: TAPAZOLE Take 1 tablet (5 mg total) by mouth 2 (two) times daily.   metoprolol succinate 25 MG 24 hr tablet Commonly known as: TOPROL-XL Take 25 mg by mouth daily.   triamterene-hydrochlorothiazide 37.5-25 MG tablet Commonly known as: MAXZIDE-25 Take 1 tablet by mouth daily.           No past medical history on file.  No past surgical history on file.  Family History  Problem Relation Age of Onset  . Diabetes Father   . Diabetes Sister   . Diabetes Brother   . Thyroid disease Neg Hx     Social History:  reports that she has never smoked. She has never used smokeless tobacco. She reports previous alcohol use. No history on file for drug use.  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 She checks fasting blood sugars at home  Lab Results  Component Value Date   HGBA1C 6.2 (A) 08/02/2020   HGBA1C 6.5 04/11/2020   No results found for: GLUF, MICROALBUR, LDLCALC, CREATININE    Examination:   BP 130/84   Pulse 90   Ht 5\' 5"  (1.651 m)   Wt 216 lb 8 oz (98.2 kg)   SpO2 99%   BMI 36.03 kg/m    She looks well and is not unusually anxious   THYROID exam: Right lobe is felt on swallowing, diffuse and somewhat nodular, firm and about 2-2.5 times Left side is nodular also had about twice normal  Biceps reflexes appear very brisk No significant tremor   Assessment/Plan:   Hyperthyroidism, 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   She also has a multinodular goiter on exam  She was treated with I-131 in 05/2020  She has had a recurrence of her hyperthyroidism despite her having an  uptake of 51% and getting 21  mCi  Currently with weight loss and mild tachycardia as well as brisk reflexes she is likely is still hyperthyroid despite taking 5 mg twice daily of methimazole  Thyroid enlargement has increased  We will check her labs and decide on further management Discussed possibility of doing another I-131 treatment and will probably need 30 mCi  Reather Littler 10/23/2020, 8:40 AM    Note: This office note was prepared with Dragon voice recognition system technology. Any transcriptional errors that result from this process are unintentional.  Addendum: Free T4 is about the same and T3 is high at 4.8 She will double the dose of methimazole to 10 mg twice daily

## 2020-10-24 LAB — THYROTROPIN RECEPTOR AUTOABS: Thyrotropin Receptor Ab: 118 IU/L — ABNORMAL HIGH (ref 0.00–1.75)

## 2020-10-25 ENCOUNTER — Other Ambulatory Visit: Payer: Self-pay | Admitting: *Deleted

## 2020-10-25 ENCOUNTER — Encounter: Payer: Self-pay | Admitting: *Deleted

## 2020-10-25 MED ORDER — METHIMAZOLE 10 MG PO TABS: 5.0000 mg | ORAL_TABLET | Freq: Two times a day (BID) | ORAL | 1 refills | Status: DC

## 2020-10-25 MED ORDER — METHIMAZOLE 10 MG PO TABS
5.0000 mg | ORAL_TABLET | Freq: Two times a day (BID) | ORAL | 1 refills | Status: DC
Start: 2020-10-25 — End: 2020-10-25

## 2020-10-26 ENCOUNTER — Other Ambulatory Visit: Payer: Self-pay | Admitting: Endocrinology

## 2020-10-26 MED ORDER — METHIMAZOLE 10 MG PO TABS: 10.0000 mg | ORAL_TABLET | Freq: Two times a day (BID) | ORAL | 1 refills | Status: DC

## 2020-11-28 ENCOUNTER — Other Ambulatory Visit: Payer: Self-pay

## 2020-11-28 ENCOUNTER — Ambulatory Visit (INDEPENDENT_AMBULATORY_CARE_PROVIDER_SITE_OTHER): Payer: Medicare Other | Admitting: Endocrinology

## 2020-11-28 ENCOUNTER — Encounter: Payer: Self-pay | Admitting: Endocrinology

## 2020-11-28 VITALS — BP 128/86 | HR 82 | Ht 65.0 in | Wt 211.4 lb

## 2020-11-28 DIAGNOSIS — E0590 Thyrotoxicosis, unspecified without thyrotoxic crisis or storm: Secondary | ICD-10-CM

## 2020-11-28 LAB — ALT: ALT: 17 U/L (ref 0–35)

## 2020-11-28 LAB — T4, FREE: Free T4: 1.49 ng/dL (ref 0.60–1.60)

## 2020-11-28 LAB — T3, FREE: T3, Free: 5.1 pg/mL — ABNORMAL HIGH (ref 2.3–4.2)

## 2020-11-28 LAB — TSH: TSH: <0.01 u[IU]/mL — ABNORMAL LOW (ref 0.35–4.50)

## 2020-11-28 NOTE — Progress Notes (Signed)
Patient ID: Erica [name], female   DOB: August 20, 1941, 79 y.o.   MRN: [id number]                                                                                                              Reason for Appointment:  Hyperthyroidism, follow-up visit  Chief complaint: Follow-up of thyroid   History of Present Illness:   Background history: She says she has been diagnosed to have an overactive thyroid over 10 years ago Initially she had symptoms of palpitations, feeling moody, feeling excessively warm and sweaty, and fatigue.  The patient had lost unknown amount of weight also  He has been treated with methimazole in variable doses, presumably over 20 mg at 1 time For some time he has been taking only 5 mg daily She was followed by an endocrinologist at [facility] and was last seen there in 04/2019 and at that time was taking 10 mg methimazole  Apparently at that time her TSH had gone up to 10.1 and she was told to reduce the dose to 5 mg but did not have regular follow-up subsequently until she was seen by her PCP   RECENT HISTORY:  On her initial consultation she felt that she may have lost some weight and was having persistent tiredness She did not have any other symptoms suggestive of hyperthyroidism  Her thyroid levels in 4/21 were normal but her thyrotropin receptor antibody was 21.7  She was treated with radioactive iodine on 05/26/2020 with 20.7 mCi I-131 sodium iodide  Methimazole was stopped in 7/21 when her TSH was high Since 8/21 she appears to be losing weight progressively and she was hypothyroid again  She is back on methimazole and now taking 10 mg twice daily, she appears to be a little unsure of how much she is taking Previously was having the symptoms of jitteriness and palpitations and sometimes feeling hot She is feeling better and does not complain of fatigue, her appetite is not normal but better Since her last visit she has lost another 5 pounds  Last free T4  was significantly high around 2 and free T3 was also above normal   Labs pending  Wt Readings from Last 3 Encounters:  11/28/20 211 lb 6.4 oz (95.9 kg)  10/23/20 216 lb 8 oz (98.2 kg)  09/20/20 223 lb (101.2 kg)    Thyroid function tests as follows:     TSH done on 03/17/2020 was 4.65, free T4 = 0.85 and total T3 = 98  Lab Results  Component Value Date   FREET4 1.97 (H) 10/23/2020   FREET4 2.11 (H) 09/20/2020   FREET4 0.77 08/02/2020   T3FREE 4.8 (H) 10/23/2020   T3FREE 3.3 08/02/2020   T3FREE 2.7 04/11/2020   TSH <0.01 Repeated and verified X2. (L) 10/23/2020   TSH 0.01 (L) 09/20/2020   TSH 1.48 08/02/2020    Lab Results  Component Value Date   THYROTRECAB 118.00 (H) 10/23/2020   THYROTRECAB 21.70 (H) 04/11/2020    02/3611:24 hour radio iodine uptake calculated  at 51%  Previous thyrotropin receptor antibody done in 11/2017 was 19.6 through [facility]  Evaluation done at [facility] in 2019:  Thyroid nodules: thyroid US confirmed the presence of 3 very large nodules (2xRt and 1xLT) which all meet criteria for Bx. NM RIU with >60% uptake and cold nodules. FNA with benign results   Allergies as of 11/28/2020   No Known Allergies     Medication List       Accurate as of November 28, 2020 10:25 AM. If you have any questions, ask your nurse or doctor.        albuterol 108 (90 Base) MCG/ACT inhaler Commonly known as: VENTOLIN HFA Inhale 2 puffs into the lungs 4 (four) times daily as needed for shortness of breath. Inhale up to 4 times daily as needed.   albuterol (2.5 MG/3ML) 0.083% nebulizer solution Commonly known as: PROVENTIL Inhale 3 mLs into the lungs 3 (three) times daily as needed for shortness of breath. Inhale 86mL via nebulizer up to three times daily as needed.   aspirin 81 MG EC tablet Take 1 tablet by mouth daily.   diclofenac Sodium 1 % Gel Commonly known as: VOLTAREN Apply 1 application topically 4 (four) times daily as needed for pain. Apply one  application topically up to four times daily for knee pain.   docusate sodium 100 MG capsule Commonly known as: COLACE Take 100 mg by mouth daily.   dorzolamide 2 % ophthalmic solution Commonly known as: TRUSOPT Place 1 drop into the right eye 2 (two) times daily. Instill 1 drop in right eye 2 times daily.   latanoprost 0.005 % ophthalmic solution Commonly known as: XALATAN Place 1 drop into both eyes at bedtime.   metFORMIN 1000 MG tablet Commonly known as: GLUCOPHAGE Take 1,000 mg by mouth daily.   methimazole 10 MG tablet Commonly known as: TAPAZOLE Take 1 tablet (10 mg total) by mouth 2 (two) times daily. Dosage has been increased   metoprolol succinate 25 MG 24 hr tablet Commonly known as: TOPROL-XL Take 25 mg by mouth daily.   triamterene-hydrochlorothiazide 37.5-25 MG tablet Commonly known as: MAXZIDE-25 Take 1 tablet by mouth daily.           No past medical history on file.  No past surgical history on file.  Family History  Problem Relation Age of Onset  . Diabetes Father   . Diabetes Sister   . Diabetes Brother   . Thyroid disease Neg Hx     Social History:  reports that she has never smoked. She has never used smokeless tobacco. She reports previous alcohol use. No history on file for drug use.  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 She checks fasting blood sugars at home  Lab Results  Component Value Date   HGBA1C 6.2 (A) 08/02/2020   HGBA1C 6.5 04/11/2020   No results found for: GLUF, MICROALBUR, LDLCALC, CREATININE    Examination:   BP 128/86   Pulse 82   Ht 5\' 5"  (1.651 m)   Wt 211 lb 6.4 oz (95.9 kg)   SpO2 96%   BMI 35.18 kg/m    She looks well, not hyperkinetic   THYROID exam: Right lobe is better felt laterally on swallowing, diffuse and somewhat nodular, firm and about twice normal Left side is nodular, about 1-1/2-2 times normal  Biceps  reflexes are somewhat brisk No tremor Hands are not unusually warm or sweaty  Assessment/Plan:   Hyperthyroidism, 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   She also has a multinodular goiter on exam  She was treated with I-131 in 05/2020  She has had a recurrence of her hyperthyroidism despite her having an uptake of 51% and getting 21 mCi  Currently is doing better symptomatically with 10 mg methimazole twice daily  Her goiter is slightly smaller than on her last visit  We will check her labs and decide on further management  Discussed possibility of doing another I-131 treatment and will probably need 30 mCi if this is done  Reather Littler 11/28/2020, 10:25 AM    Note: This office note was prepared with Dragon voice recognition system technology. Any transcriptional errors that result from this process are unintentional.

## 2020-11-29 LAB — CBC WITH DIFFERENTIAL/PLATELET
Basophils Absolute: 0 10*3/uL (ref 0.0–0.1)
Basophils Relative: 0.3 % (ref 0.0–3.0)
Eosinophils Absolute: 0.1 10*3/uL (ref 0.0–0.7)
Eosinophils Relative: 0.8 % (ref 0.0–5.0)
HCT: 34.3 % — ABNORMAL LOW (ref 36.0–46.0)
Hemoglobin: 11.3 g/dL — ABNORMAL LOW (ref 12.0–15.0)
Lymphocytes Relative: 31 % (ref 12.0–46.0)
Lymphs Abs: 2.7 10*3/uL (ref 0.7–4.0)
MCHC: 32.9 g/dL (ref 30.0–36.0)
MCV: 87.4 fl (ref 78.0–100.0)
Monocytes Absolute: 0.6 10*3/uL (ref 0.1–1.0)
Monocytes Relative: 7 % (ref 3.0–12.0)
Neutro Abs: 5.2 10*3/uL (ref 1.4–7.7)
Neutrophils Relative %: 60.9 % (ref 43.0–77.0)
Platelets: 203 10*3/uL (ref 150.0–400.0)
RBC: 3.92 Mil/uL (ref 3.87–5.11)
RDW: 14.6 % (ref 11.5–15.5)
WBC: 8.6 10*3/uL (ref 4.0–10.5)

## 2020-11-29 NOTE — Progress Notes (Signed)
Thyroid level is still high.  Confirm she is taking 10 mg methimazole twice a day, need to increase the dose to 15 mg in the morning and 10 mg in the evening.  Recommend that she have another radioactive iodine treatment if agreeable.  Needs follow-up scheduled in 1 month again

## 2020-12-04 ENCOUNTER — Other Ambulatory Visit: Payer: Self-pay | Admitting: *Deleted

## 2020-12-04 MED ORDER — METHIMAZOLE 10 MG PO TABS: ORAL_TABLET | ORAL | 1 refills | Status: DC

## 2020-12-05 LAB — HEMOGLOBIN A1C
Hemoglobin A1C: 6.1
Hemoglobin A1C: 6.1

## 2020-12-12 ENCOUNTER — Encounter: Payer: Self-pay | Admitting: *Deleted

## 2021-01-01 ENCOUNTER — Telehealth: Payer: No Typology Code available for payment source | Admitting: Endocrinology

## 2021-01-01 ENCOUNTER — Other Ambulatory Visit: Payer: Self-pay

## 2021-01-01 ENCOUNTER — Ambulatory Visit: Payer: Medicare Other | Admitting: Endocrinology

## 2021-01-02 ENCOUNTER — Other Ambulatory Visit: Payer: Self-pay

## 2021-01-03 ENCOUNTER — Ambulatory Visit (INDEPENDENT_AMBULATORY_CARE_PROVIDER_SITE_OTHER): Payer: Medicare PPO | Admitting: Endocrinology

## 2021-01-03 ENCOUNTER — Other Ambulatory Visit: Payer: Self-pay | Admitting: *Deleted

## 2021-01-03 ENCOUNTER — Ambulatory Visit: Payer: No Typology Code available for payment source | Admitting: Endocrinology

## 2021-01-03 ENCOUNTER — Encounter: Payer: Self-pay | Admitting: Endocrinology

## 2021-01-03 VITALS — BP 130/84 | HR 76 | Ht 65.0 in | Wt 203.2 lb

## 2021-01-03 DIAGNOSIS — E1169 Type 2 diabetes mellitus with other specified complication: Secondary | ICD-10-CM | POA: Diagnosis not present

## 2021-01-03 DIAGNOSIS — R630 Anorexia: Secondary | ICD-10-CM | POA: Diagnosis not present

## 2021-01-03 DIAGNOSIS — E669 Obesity, unspecified: Secondary | ICD-10-CM | POA: Diagnosis not present

## 2021-01-03 DIAGNOSIS — E0590 Thyrotoxicosis, unspecified without thyrotoxic crisis or storm: Secondary | ICD-10-CM

## 2021-01-03 LAB — COMPREHENSIVE METABOLIC PANEL
ALT: 9 U/L (ref 0–35)
AST: 11 U/L (ref 0–37)
Albumin: 4.5 g/dL (ref 3.5–5.2)
Alkaline Phosphatase: 66 U/L (ref 39–117)
BUN: 25 mg/dL — ABNORMAL HIGH (ref 6–23)
CO2: 27 mEq/L (ref 19–32)
Calcium: 11.1 mg/dL — ABNORMAL HIGH (ref 8.4–10.5)
Chloride: 105 mEq/L (ref 96–112)
Creatinine, Ser: 1.04 mg/dL (ref 0.40–1.20)
GFR: 50.35 mL/min — ABNORMAL LOW (ref >60.00)
Glucose, Bld: 86 mg/dL (ref 70–99)
Potassium: 4.8 mEq/L (ref 3.5–5.1)
Sodium: 139 mEq/L (ref 135–145)
Total Bilirubin: 0.3 mg/dL (ref 0.2–1.2)
Total Protein: 7.1 g/dL (ref 6.0–8.3)

## 2021-01-03 LAB — TSH: TSH: <0.01 u[IU]/mL — ABNORMAL LOW (ref 0.35–4.50)

## 2021-01-03 LAB — T3, FREE: T3, Free: 3.8 pg/mL (ref 2.3–4.2)

## 2021-01-03 LAB — T4, FREE: Free T4: 0.77 ng/dL (ref 0.60–1.60)

## 2021-01-03 NOTE — Patient Instructions (Addendum)
Stop metformin  Methimazole stop 5-7 days before radioactive test and resume 3 days after treatment dose

## 2021-01-03 NOTE — Progress Notes (Addendum)
Patient ID: Erica [name], female   DOB: May 14, 1941, 80 y.o.   MRN: [id number]                                                                                                              Reason for Appointment:  Hyperthyroidism, follow-up visit  Chief complaint: Follow-up of thyroid   History of Present Illness:   Background history: She says she has been diagnosed to have an overactive thyroid over 10 years ago Initially she had symptoms of palpitations, feeling moody, feeling excessively warm and sweaty, and fatigue.  The patient had lost unknown amount of weight also  He has been treated with methimazole in variable doses, presumably over 20 mg at 1 time For some time he has been taking only 5 mg daily She was followed by an endocrinologist at [facility] and was last seen there in 04/2019 and at that time was taking 10 mg methimazole  Apparently at that time her TSH had gone up to 10.1 and she was told to reduce the dose to 5 mg but did not have regular follow-up subsequently until she was seen by her PCP   RECENT HISTORY:  On her initial consultation she felt that she may have lost some weight and was having persistent tiredness She did not have any other symptoms suggestive of hyperthyroidism  Her thyroid levels in 4/21 were normal but her thyrotropin receptor antibody was 21.7  She was treated with radioactive iodine on 05/26/2020 with 20.7 mCi I-131 sodium iodide  Methimazole was stopped in 7/21 when her TSH was high Since 8/21 she appears to be hyperthyroid again  She is continuing on methimazole and now taking 15 mg in the morning and 10 mg in the evening as above 12/21 Her prescription was changed Previously was having the symptoms of jitteriness and palpitations but now she is mostly concerned about her appetite being absent  She is feeling fatigue also now and no significant heat intolerance or palpitations  She continues to lose weight  Last free T4 was not high  but free T3 was 5.1 with suppressed TSH   Labs pending  Wt Readings from Last 3 Encounters:  01/03/21 203 lb 3.2 oz (92.2 kg)  11/28/20 211 lb 6.4 oz (95.9 kg)  10/23/20 216 lb 8 oz (98.2 kg)    Thyroid function tests as follows:     TSH done on 03/17/2020 was 4.65, free T4 = 0.85 and total T3 = 98  Lab Results  Component Value Date   FREET4 1.49 11/28/2020   FREET4 1.97 (H) 10/23/2020   FREET4 2.11 (H) 09/20/2020   T3FREE 5.1 (H) 11/28/2020   T3FREE 4.8 (H) 10/23/2020   T3FREE 3.3 08/02/2020   TSH <0.01 (L) 11/28/2020   TSH <0.01 Repeated and verified X2. (L) 10/23/2020   TSH 0.01 (L) 09/20/2020    Lab Results  Component Value Date   THYROTRECAB 118.00 (H) 10/23/2020   THYROTRECAB 21.70 (H) 04/11/2020    03/4009:27 hour radio iodine uptake calculated at 51%  Previous thyrotropin receptor antibody done in 11/2017 was 19.6 through [facility]  Evaluation done at [facility] in 2019:  Thyroid nodules: thyroid US confirmed the presence of 3 very large nodules (2xRt and 1xLT) which all meet criteria for Bx. NM RIU with >60% uptake and cold nodules. FNA with benign results   Allergies as of 01/03/2021   No Known Allergies     Medication List       Accurate as of January 03, 2021 10:08 AM. If you have any questions, ask your nurse or doctor.        albuterol 108 (90 Base) MCG/ACT inhaler Commonly known as: VENTOLIN HFA Inhale 2 puffs into the lungs 4 (four) times daily as needed for shortness of breath. Inhale up to 4 times daily as needed.   albuterol (2.5 MG/3ML) 0.083% nebulizer solution Commonly known as: PROVENTIL Inhale 3 mLs into the lungs 3 (three) times daily as needed for shortness of breath. Inhale 18mL via nebulizer up to three times daily as needed.   aspirin 81 MG EC tablet Take 1 tablet by mouth daily.   diclofenac Sodium 1 % Gel Commonly known as: VOLTAREN Apply 1 application topically 4 (four) times daily as needed for pain. Apply one application topically  up to four times daily for knee pain.   docusate sodium 100 MG capsule Commonly known as: COLACE Take 100 mg by mouth daily.   dorzolamide 2 % ophthalmic solution Commonly known as: TRUSOPT Place 1 drop into the right eye 2 (two) times daily. Instill 1 drop in right eye 2 times daily.   latanoprost 0.005 % ophthalmic solution Commonly known as: XALATAN Place 1 drop into both eyes at bedtime.   metFORMIN 1000 MG tablet Commonly known as: GLUCOPHAGE Take 1,000 mg by mouth daily.   methimazole 10 MG tablet Commonly known as: TAPAZOLE Take 1 1/2 tablets (15mg ) in the morning and 1 tablet in the evening   metoprolol succinate 25 MG 24 hr tablet Commonly known as: TOPROL-XL Take 25 mg by mouth daily.   triamterene-hydrochlorothiazide 37.5-25 MG tablet Commonly known as: MAXZIDE-25 Take 1 tablet by mouth daily.           No past medical history on file.  No past surgical history on file.  Family History  Problem Relation Age of Onset  . Diabetes Father   . Diabetes Sister   . Diabetes Brother   . Thyroid disease Neg Hx     Social History:  reports that she has never smoked. She has never used smokeless tobacco. She reports previous alcohol use. No history on file for drug use.  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 She checks fasting blood sugars at home and these are between about 90-108  Lab Results  Component Value Date   HGBA1C 6.1 12/05/2020   HGBA1C 6.2 (A) 08/02/2020   HGBA1C 6.5 04/11/2020   No results found for: GLUF, MICROALBUR, LDLCALC, CREATININE    Examination:   BP 130/84   Pulse 76   Ht 5\' 5"  (1.651 m)   Wt 203 lb 3.2 oz (92.2 kg)   SpO2 99%   BMI 33.81 kg/m    She looks well, not hyperkinetic No prominence of the eyes or stare  THYROID exam: Right lobe is enlarged, diffuse and nodular, firm and about 1.5-2 times normal Left side is also performed and  nodular, about 1-1/2-2 times normal  Biceps reflexes are  brisk No tremor Hands are not unusually warm or sweaty No peripheral edema   Assessment/Plan:   Hyperthyroidism, 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   She also has a multinodular goiter on exam  She was treated with I-131 in 05/2020 with about 21 mCi  Her hyperthyroidism has recurred 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 not been treated adequately but also had very high thyrotropin receptor antibody in 11/21  With a total of 25 mg methimazole she is overall doing better but not clear why she is having decreased appetite and weight loss  Her goiter is about the same recently but significantly smaller than on her initial visit  Thyroid function will be rechecked to adjust her methimazole   She agrees to repeat treatment with I-131 Will schedule another I-131 treatment after uptake and scan and will probably need 30 mCi to treat the toxic multinodular goiter Discussed how this will be done and how to adjust her methimazole pre and post her studies and treatment  Decreased appetite: Unclear of the etiology, previously her liver functions were normal She will leave off her metformin for a while since sugars are better with weight loss and may help her appetite  Reather Littler 01/03/2021, 10:08 AM    Note: This office note was prepared with Dragon voice recognition system technology. Any transcriptional errors that result from this process are unintentional.  Addendum: Labs improved with T4 0.77.  She will go back to 20 mg methimazole daily until radioactive treatment completed Chemistry normal

## 2021-01-05 ENCOUNTER — Encounter: Payer: Self-pay | Admitting: *Deleted

## 2021-01-08 ENCOUNTER — Encounter: Payer: Self-pay | Admitting: *Deleted

## 2021-01-29 ENCOUNTER — Encounter (HOSPITAL_COMMUNITY)
Admission: RE | Admit: 2021-01-29 | Discharge: 2021-01-29 | Disposition: A | Discharge status: Home / Self Care | Payer: Medicare PPO | Source: Ambulatory Visit | Attending: Endocrinology | Admitting: Endocrinology

## 2021-01-29 ENCOUNTER — Encounter (HOSPITAL_COMMUNITY): Payer: No Typology Code available for payment source

## 2021-01-29 ENCOUNTER — Other Ambulatory Visit: Payer: Self-pay

## 2021-01-29 DIAGNOSIS — E0590 Thyrotoxicosis, unspecified without thyrotoxic crisis or storm: Secondary | ICD-10-CM

## 2021-01-29 MED ORDER — SODIUM IODIDE I-123 7.4 MBQ CAPS
424.4000 | ORAL_CAPSULE | Freq: Once | ORAL | Status: AC
  Administered 2021-01-29: 424.4 via ORAL

## 2021-01-30 ENCOUNTER — Encounter (HOSPITAL_COMMUNITY)
Admission: RE | Admit: 2021-01-30 | Discharge: 2021-01-30 | Disposition: A | Discharge status: Home / Self Care | Payer: Medicare PPO | Source: Ambulatory Visit | Attending: Endocrinology | Admitting: Endocrinology

## 2021-01-30 ENCOUNTER — Other Ambulatory Visit (HOSPITAL_COMMUNITY): Payer: No Typology Code available for payment source

## 2021-02-04 ENCOUNTER — Other Ambulatory Visit: Payer: Self-pay | Admitting: Endocrinology

## 2021-02-04 DIAGNOSIS — E0590 Thyrotoxicosis, unspecified without thyrotoxic crisis or storm: Secondary | ICD-10-CM

## 2021-02-19 ENCOUNTER — Other Ambulatory Visit: Payer: Self-pay

## 2021-02-19 ENCOUNTER — Ambulatory Visit: Payer: Medicare PPO | Admitting: Endocrinology

## 2021-02-23 ENCOUNTER — Encounter (HOSPITAL_COMMUNITY): Payer: Self-pay

## 2021-02-23 ENCOUNTER — Encounter (HOSPITAL_COMMUNITY)
Admission: RE | Admit: 2021-02-23 | Discharge: 2021-02-23 | Disposition: A | Discharge status: Home / Self Care | Payer: Medicare PPO | Source: Ambulatory Visit | Attending: Endocrinology | Admitting: Endocrinology

## 2021-02-23 DIAGNOSIS — E0590 Thyrotoxicosis, unspecified without thyrotoxic crisis or storm: Secondary | ICD-10-CM

## 2021-02-23 MED ORDER — SODIUM IODIDE I 131 CAPSULE
30.0000 | Freq: Once | INTRAVENOUS | Status: AC | PRN
  Administered 2021-02-23: 29.1 via ORAL

## 2021-03-05 ENCOUNTER — Ambulatory Visit: Payer: Medicare PPO | Admitting: Endocrinology

## 2021-03-06 ENCOUNTER — Other Ambulatory Visit: Payer: Self-pay

## 2021-03-06 ENCOUNTER — Ambulatory Visit (INDEPENDENT_AMBULATORY_CARE_PROVIDER_SITE_OTHER): Payer: Medicare PPO | Admitting: Endocrinology

## 2021-03-06 ENCOUNTER — Encounter: Payer: Self-pay | Admitting: Endocrinology

## 2021-03-06 VITALS — BP 130/82 | HR 68 | Resp 20 | Ht 65.0 in | Wt 197.2 lb

## 2021-03-06 DIAGNOSIS — E0590 Thyrotoxicosis, unspecified without thyrotoxic crisis or storm: Secondary | ICD-10-CM

## 2021-03-06 LAB — CBC WITH DIFFERENTIAL/PLATELET
Basophils Absolute: 0 10*3/uL (ref 0.0–0.1)
Basophils Relative: 0.2 % (ref 0.0–3.0)
Eosinophils Absolute: 0.1 10*3/uL (ref 0.0–0.7)
Eosinophils Relative: 0.9 % (ref 0.0–5.0)
HCT: 36.2 % (ref 36.0–46.0)
Hemoglobin: 12 g/dL (ref 12.0–15.0)
Lymphocytes Relative: 31.6 % (ref 12.0–46.0)
Lymphs Abs: 1.9 10*3/uL (ref 0.7–4.0)
MCHC: 33.2 g/dL (ref 30.0–36.0)
MCV: 88.7 fl (ref 78.0–100.0)
Monocytes Absolute: 0.5 10*3/uL (ref 0.1–1.0)
Monocytes Relative: 8.1 % (ref 3.0–12.0)
Neutro Abs: 3.6 10*3/uL (ref 1.4–7.7)
Neutrophils Relative %: 59.2 % (ref 43.0–77.0)
Platelets: 198 10*3/uL (ref 150.0–400.0)
RBC: 4.08 Mil/uL (ref 3.87–5.11)
RDW: 14.4 % (ref 11.5–15.5)
WBC: 6.2 10*3/uL (ref 4.0–10.5)

## 2021-03-06 LAB — T4, FREE: Free T4: 0.84 ng/dL (ref 0.60–1.60)

## 2021-03-06 LAB — T3, FREE: T3, Free: 3 pg/mL (ref 2.3–4.2)

## 2021-03-06 LAB — ALT: ALT: 9 U/L (ref 0–35)

## 2021-03-06 NOTE — Progress Notes (Signed)
Patient ID: Erica [name], female   DOB: 08-Jun-1941, 80 y.o.   MRN: [id number]                                                                                                              Reason for Appointment:  Hyperthyroidism, follow-up visit  Chief complaint: Follow-up of thyroid   History of Present Illness:   Background history: She says she has been diagnosed to have an overactive thyroid over 10 years ago Initially she had symptoms of palpitations, feeling moody, feeling excessively warm and sweaty, and fatigue.  The patient had lost unknown amount of weight also  He has been treated with methimazole in variable doses, presumably over 20 mg at 1 time For some time he has been taking only 5 mg daily She was followed by an endocrinologist at [facility] and was last seen there in 04/2019 and at that time was taking 10 mg methimazole  Apparently at that time her TSH had gone up to 10.1 and she was told to reduce the dose to 5 mg but did not have regular follow-up subsequently until she was seen by her PCP   RECENT HISTORY:  On her initial consultation she felt that she may have lost some weight and was having persistent tiredness She did not have any other symptoms suggestive of hyperthyroidism  Her thyroid levels in 4/21 were normal but her thyrotropin receptor antibody was 21.7  She was treated with radioactive iodine on 05/26/2020 with 20.7 mCi I-131 sodium iodide Since 8/21 she became hyperthyroid again  Since she did not have a remission on methimazole she has been recently treated again with I-131 She had 29.1 mCi I-131 done on 02/23/2021  She confirmed that she stopped it 5 days before the treatment and started 4 days later She is giving conflicting answers about how much methimazole she is taking and may still be taking a total of 25 mg a day even though she was supposed to cut it down to 20 mg  No further symptoms of jitteriness and palpitations but now she is  appearing to be still losing weight No fatigue   Last free T4 was normal along with free T3  Labs pending  Wt Readings from Last 3 Encounters:  03/06/21 197 lb 3.2 oz (89.4 kg)  01/03/21 203 lb 3.2 oz (92.2 kg)  11/28/20 211 lb 6.4 oz (95.9 kg)    Thyroid function tests as follows:     TSH done on 03/17/2020 was 4.65, free T4 = 0.85 and total T3 = 98  Lab Results  Component Value Date   FREET4 0.77 01/03/2021   FREET4 1.49 11/28/2020   FREET4 1.97 (H) 10/23/2020   T3FREE 3.8 01/03/2021   T3FREE 5.1 (H) 11/28/2020   T3FREE 4.8 (H) 10/23/2020   TSH <0.01 (L) 01/03/2021   TSH <0.01 (L) 11/28/2020   TSH <0.01 Repeated and verified X2. (L) 10/23/2020    Lab Results  Component Value Date   THYROTRECAB 118.00 (H) 10/23/2020   THYROTRECAB 21.70 (  H) 04/11/2020    02/7857:85 hour radio iodine uptake calculated at 51%  Previous thyrotropin receptor antibody done in 11/2017 was 19.6 through [facility]  Evaluation done at [facility] in 2019:  Thyroid nodules: thyroid US confirmed the presence of 3 very large nodules (2xRt and 1xLT) which all meet criteria for Bx. NM RIU with >60% uptake and cold nodules. FNA with benign results   Allergies as of 03/06/2021   No Known Allergies     Medication List       Accurate as of March 06, 2021  9:44 AM. If you have any questions, ask your nurse or doctor.        albuterol 108 (90 Base) MCG/ACT inhaler Commonly known as: VENTOLIN HFA Inhale 2 puffs into the lungs 4 (four) times daily as needed for shortness of breath. Inhale up to 4 times daily as needed.   albuterol (2.5 MG/3ML) 0.083% nebulizer solution Commonly known as: PROVENTIL Inhale 3 mLs into the lungs 3 (three) times daily as needed for shortness of breath. Inhale 66mL via nebulizer up to three times daily as needed.   aspirin 81 MG EC tablet Take 1 tablet by mouth daily.   diclofenac Sodium 1 % Gel Commonly known as: VOLTAREN Apply 1 application topically 4 (four) times daily  as needed for pain. Apply one application topically up to four times daily for knee pain.   docusate sodium 100 MG capsule Commonly known as: COLACE Take 100 mg by mouth daily.   dorzolamide 2 % ophthalmic solution Commonly known as: TRUSOPT Place 1 drop into the right eye 2 (two) times daily. Instill 1 drop in right eye 2 times daily.   latanoprost 0.005 % ophthalmic solution Commonly known as: XALATAN Place 1 drop into both eyes at bedtime.   metFORMIN 1000 MG tablet Commonly known as: GLUCOPHAGE Take 1,000 mg by mouth daily.   methimazole 10 MG tablet Commonly known as: TAPAZOLE Take 1 1/2 tablets (15mg ) in the morning and 1 tablet in the evening   metoprolol succinate 25 MG 24 hr tablet Commonly known as: TOPROL-XL Take 25 mg by mouth daily.   triamterene-hydrochlorothiazide 37.5-25 MG tablet Commonly known as: MAXZIDE-25 Take 1 tablet by mouth daily.           No past medical history on file.  No past surgical history on file.  Family History  Problem Relation Age of Onset  . Diabetes Father   . Diabetes Sister   . Diabetes Brother   . Thyroid disease Neg Hx     Social History:  reports that she has never smoked. She has never used smokeless tobacco. She reports previous alcohol use. No history on file for drug use.  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 1000mg  She checks fasting blood sugars at home and these are between about 90-108  Lab Results  Component Value Date   HGBA1C 6.1 12/05/2020   HGBA1C 6.1 12/05/2020   HGBA1C 6.2 (A) 08/02/2020   Lab Results  Component Value Date   CREATININE 1.04 01/03/2021      Examination:   BP 130/82 (BP Location: Left Arm, Patient Position: Sitting, Cuff Size: Normal)   Pulse 68   Resp 20   Ht 5\' 5"  (1.651 m)   Wt 197 lb 3.2 oz (89.4 kg)   SpO2 96%   BMI 32.82 kg/m    Biceps reflexes are brisk No tremor Hands are not  unusually warm or sweaty    Assessment/Plan:   Hyperthyroidism, 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   She also has a multinodular goiter on exam  She was treated with I-131 in 05/2020 with about 21 mCi and again with 29 mCi about 11 days ago  Her hyperthyroidism has recurred 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 Any transcriptional errors that result from this process are unintentional.

## 2021-03-07 ENCOUNTER — Telehealth: Payer: Self-pay

## 2021-03-07 NOTE — Telephone Encounter (Signed)
Pt verbalized understanding.

## 2021-03-07 NOTE — Telephone Encounter (Signed)
-----   Message from Reather Littler, MD sent at 03/07/2021 10:16 AM EDT ----- Please let patient know that the results are normal. She can  reduce Methimazole to 10 mg daily for two weeks and then stop

## 2021-04-11 ENCOUNTER — Encounter: Payer: Self-pay | Admitting: Endocrinology

## 2021-04-11 ENCOUNTER — Ambulatory Visit: Payer: Medicare PPO | Admitting: Endocrinology

## 2021-04-11 ENCOUNTER — Other Ambulatory Visit: Payer: Self-pay

## 2021-04-11 VITALS — BP 118/78 | HR 66 | Ht 65.0 in | Wt 196.8 lb

## 2021-04-11 DIAGNOSIS — E0590 Thyrotoxicosis, unspecified without thyrotoxic crisis or storm: Secondary | ICD-10-CM | POA: Diagnosis not present

## 2021-04-11 LAB — T3, FREE: T3, Free: 3.4 pg/mL (ref 2.3–4.2)

## 2021-04-11 LAB — TSH: TSH: 0.03 u[IU]/mL — ABNORMAL LOW (ref 0.35–4.50)

## 2021-04-11 NOTE — Progress Notes (Signed)
Patient ID: Erica [name], female   DOB: 01-25-1941, 80 y.o.   MRN: [id number]                                                                                                              Reason for Appointment:  Hyperthyroidism, follow-up visit  Chief complaint: Follow-up of thyroid   History of Present Illness:   Background history: She says she has been diagnosed to have an overactive thyroid over 10 years ago Initially she had symptoms of palpitations, feeling moody, feeling excessively warm and sweaty, and fatigue.  The patient had lost unknown amount of weight also  He has been treated with methimazole in variable doses, presumably over 20 mg at 1 time For some time he has been taking only 5 mg daily She was followed by an endocrinologist at [facility] and was last seen there in 04/2019 and at that time was taking 10 mg methimazole  Apparently at that time her TSH had gone up to 10.1 and she was told to reduce the dose to 5 mg but did not have regular follow-up subsequently until she was seen by her PCP   RECENT HISTORY:  On her initial consultation she felt that she may have lost some weight and was having persistent tiredness She did not have any other symptoms suggestive of hyperthyroidism  Her thyroid levels in 4/21 were normal but her thyrotropin receptor antibody was 21.7  She was treated with radioactive iodine on 05/26/2020 with 20.7 mCi I-131 sodium iodide As of 8/21 she became hyperthyroid again  Since she did not have a remission on methimazole she has been recently treated again with I-131 She had 29.1 mCi I-131 done on 02/23/2021  Thyroid levels had improved as of 3/22 and her methimazole was stopped after 2 weeks, previously on 20 mg She says she feels better with her energy level overall Weight has leveled off She tends to get cold easily  Labs pending  Wt Readings from Last 3 Encounters:  04/11/21 196 lb 12.8 oz (89.3 kg)  03/06/21 197 lb 3.2 oz (89.4  kg)  01/03/21 203 lb 3.2 oz (92.2 kg)    Thyroid function tests as follows:     TSH done on 03/17/2020 was 4.65, free T4 = 0.85 and total T3 = 98  Lab Results  Component Value Date   FREET4 0.84 03/06/2021   FREET4 0.77 01/03/2021   FREET4 1.49 11/28/2020   T3FREE 3.0 03/06/2021   T3FREE 3.8 01/03/2021   T3FREE 5.1 (H) 11/28/2020   TSH <0.01 (L) 01/03/2021   TSH <0.01 (L) 11/28/2020   TSH <0.01 Repeated and verified X2. (L) 10/23/2020    Lab Results  Component Value Date   THYROTRECAB 118.00 (H) 10/23/2020   THYROTRECAB 21.70 (H) 04/11/2020    04/2020:24 hour radio iodine uptake calculated at 51%  Previous thyrotropin receptor antibody done in 11/2017 was 19.6 through [facility]  Evaluation done at [facility] in 2019:  Thyroid nodules: thyroid US confirmed the presence of 3 very large nodules (  2xRt and 1xLT) which all meet criteria for Bx. NM RIU with >60% uptake and cold nodules. FNA with benign results   Allergies as of 04/11/2021   No Known Allergies     Medication List       Accurate as of April 11, 2021 10:26 AM. If you have any questions, ask your nurse or doctor.        albuterol 108 (90 Base) MCG/ACT inhaler Commonly known as: VENTOLIN HFA Inhale 2 puffs into the lungs 4 (four) times daily as needed for shortness of breath. Inhale up to 4 times daily as needed.   albuterol (2.5 MG/3ML) 0.083% nebulizer solution Commonly known as: PROVENTIL Inhale 3 mLs into the lungs 3 (three) times daily as needed for shortness of breath. Inhale 13mL via nebulizer up to three times daily as needed.   aspirin 81 MG EC tablet Take 1 tablet by mouth daily.   diclofenac Sodium 1 % Gel Commonly known as: VOLTAREN Apply 1 application topically 4 (four) times daily as needed for pain. Apply one application topically up to four times daily for knee pain.   docusate sodium 100 MG capsule Commonly known as: COLACE Take 100 mg by mouth daily.   dorzolamide 2 % ophthalmic  solution Commonly known as: TRUSOPT Place 1 drop into the right eye 2 (two) times daily. Instill 1 drop in right eye 2 times daily.   latanoprost 0.005 % ophthalmic solution Commonly known as: XALATAN Place 1 drop into both eyes at bedtime.   metFORMIN 1000 MG tablet Commonly known as: GLUCOPHAGE Take 1,000 mg by mouth daily.   methimazole 10 MG tablet Commonly known as: TAPAZOLE Take 1 1/2 tablets (15mg ) in the morning and 1 tablet in the evening   metoprolol succinate 25 MG 24 hr tablet Commonly known as: TOPROL-XL Take 25 mg by mouth daily.   triamterene-hydrochlorothiazide 37.5-25 MG tablet Commonly known as: MAXZIDE-25 Take 1 tablet by mouth daily.           No past medical history on file.  No past surgical history on file.  Family History  Problem Relation Age of Onset  . Diabetes Father   . Diabetes Sister   . Diabetes Brother   . Thyroid disease Neg Hx     Social History:  reports that she has never smoked. She has never used smokeless tobacco. She reports previous alcohol use. No history on file for drug use.  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 1000mg  This was stopped because of relatively [name] blood sugars and her weight loss  She checks fasting blood sugars at home and these are still under 100 even without metformin  Lab Results  Component Value Date   HGBA1C 6.1 12/05/2020   HGBA1C 6.1 12/05/2020   HGBA1C 6.2 (A) 08/02/2020   Lab Results  Component Value Date   CREATININE 1.04 01/03/2021      Examination:   BP 118/78   Pulse 66   Ht 5\' 5"  (1.651 m)   Wt 196 lb 12.8 oz (89.3 kg)   SpO2 99%   BMI 32.75 kg/m   Thyroid just palpable on swallowing bilaterally, slightly firm Biceps reflexes are brisk No tremor of hands Extremities do not feel unusually warm or cold    Assessment/Plan:   Hyperthyroidism, from Graves' disease as confirmed by high  thyrotropin receptor antibody   She also has a multinodular goiter on exam  She was treated with I-131 in 05/2020 with about 21 mCi and again with 29 mCi on 02/23/2021  Her hyperthyroidism has improved with the second treatment Her thyroid levels are pending but her thyroid enlargement appears to be regressing now Subjectively doing well and weight has leveled off  Thyroid function will be rechecked today Discussed possibility of long-term supplementation if she becomes hypothyroid  Reather Littler 04/11/2021, 10:26 AM    Note: This office note was prepared with Dragon voice recognition system technology. Any transcriptional errors that result from this process are unintentional.

## 2021-04-12 LAB — T4, FREE: Free T4: 0.93 ng/dL (ref 0.60–1.60)

## 2021-04-12 NOTE — Progress Notes (Signed)
Please call to let patient know that the thyroid hormone levels are normal and keep follow-up appointment.  To call if she starts feeling unusually fatigued

## 2021-05-03 ENCOUNTER — Telehealth: Payer: Self-pay | Admitting: Endocrinology

## 2021-05-03 NOTE — Telephone Encounter (Signed)
Patient called stating she saw Dr Jake Shark and he advised her to give [place] a call to let Dr Lucianne Muss know she had abnormal lab work there and she should give [place] a call. Call back number is [phone]

## 2021-05-03 NOTE — Telephone Encounter (Signed)
I spoke with her and she was confused about which labs were not good, I asked her to call them back and have them fax [place] the results, she agreed.

## 2021-05-25 ENCOUNTER — Encounter: Payer: Self-pay | Admitting: Endocrinology

## 2021-05-25 ENCOUNTER — Other Ambulatory Visit: Payer: Self-pay

## 2021-05-25 ENCOUNTER — Ambulatory Visit (INDEPENDENT_AMBULATORY_CARE_PROVIDER_SITE_OTHER): Payer: Medicare PPO | Admitting: Endocrinology

## 2021-05-25 VITALS — BP 140/82 | HR 66 | Ht 65.0 in | Wt 197.0 lb

## 2021-05-25 DIAGNOSIS — E0590 Thyrotoxicosis, unspecified without thyrotoxic crisis or storm: Secondary | ICD-10-CM

## 2021-05-25 DIAGNOSIS — E1169 Type 2 diabetes mellitus with other specified complication: Secondary | ICD-10-CM | POA: Diagnosis not present

## 2021-05-25 DIAGNOSIS — E669 Obesity, unspecified: Secondary | ICD-10-CM

## 2021-05-25 LAB — TSH: TSH: 0.01 u[IU]/mL — ABNORMAL LOW (ref 0.35–4.50)

## 2021-05-25 LAB — T4, FREE: Free T4: 1.16 ng/dL (ref 0.60–1.60)

## 2021-05-25 LAB — GLUCOSE, RANDOM: Glucose, Bld: 89 mg/dL (ref 70–99)

## 2021-05-25 LAB — T3, FREE: T3, Free: 3.5 pg/mL (ref 2.3–4.2)

## 2021-05-25 NOTE — Progress Notes (Signed)
Patient ID: Erica [name], female   DOB: 1940-12-17, 80 y.o.   MRN: [id number]                                                                                                              Reason for Appointment:  Hyperthyroidism, follow-up visit  Chief complaint: Follow-up of thyroid   History of Present Illness:   Background history: She says she has been diagnosed to have an overactive thyroid over 10 years ago Initially she had symptoms of palpitations, feeling moody, feeling excessively warm and sweaty, and fatigue.  The patient had lost unknown amount of weight also  He has been treated with methimazole in variable doses, presumably over 20 mg at 1 time For some time he has been taking only 5 mg daily She was followed by an endocrinologist at [facility] and was last seen there in 04/2019 and at that time was taking 10 mg methimazole  Apparently at that time her TSH had gone up to 10.1 and she was told to reduce the dose to 5 mg but did not have regular follow-up subsequently until she was seen by her PCP   RECENT HISTORY:  On her initial consultation she felt that she may have lost some weight and was having persistent tiredness She did not have any other symptoms suggestive of hyperthyroidism Her thyroid levels in 4/21 were normal but her thyrotropin receptor antibody was 21.7  She was treated with radioactive iodine on 05/26/2020 with 20.7 mCi I-131 sodium iodide As of 8/21 she became hyperthyroid again  Since she did not have a remission on methimazole she has been treated again with I-131 She had 29.1 mCi I-131 done on 02/23/2021  Thyroid levels had improved as of 3/22 and her methimazole was stopped after 2 weeks, previously on 20 mg So far thyroid levels have been normal without methimazole  She feels fairly [name] with no unusual fatigue Weight has leveled off She tends to get cold easily as before Occasional palpitations as before, no shakiness or heat  intolerance  Labs pending  Wt Readings from Last 3 Encounters:  05/25/21 197 lb (89.4 kg)  04/11/21 196 lb 12.8 oz (89.3 kg)  03/06/21 197 lb 3.2 oz (89.4 kg)    Thyroid function tests as follows:     TSH done on 03/17/2020 was 4.65, free T4 = 0.85 and total T3 = 98  Lab Results  Component Value Date   FREET4 0.93 04/11/2021   FREET4 0.84 03/06/2021   FREET4 0.77 01/03/2021   T3FREE 3.4 04/11/2021   T3FREE 3.0 03/06/2021   T3FREE 3.8 01/03/2021   TSH 0.03 (L) 04/11/2021   TSH <0.01 (L) 01/03/2021   TSH <0.01 (L) 11/28/2020    Lab Results  Component Value Date   THYROTRECAB 118.00 (H) 10/23/2020   THYROTRECAB 21.70 (H) 04/11/2020    04/2020:24 hour radio iodine uptake calculated at 51%  Previous thyrotropin receptor antibody done in 11/2017 was 19.6 through [facility]  Evaluation done at [facility] in 2019:  Thyroid nodules:  thyroid US confirmed the presence of 3 very large nodules (2xRt and 1xLT) which all meet criteria for Bx. NM RIU with >60% uptake and cold nodules. FNA with benign results   Allergies as of 05/25/2021   No Known Allergies      Medication List        Accurate as of May 25, 2021 10:21 AM. If you have any questions, ask your nurse or doctor.          albuterol 108 (90 Base) MCG/ACT inhaler Commonly known as: VENTOLIN HFA Inhale 2 puffs into the lungs 4 (four) times daily as needed for shortness of breath. Inhale up to 4 times daily as needed.   albuterol (2.5 MG/3ML) 0.083% nebulizer solution Commonly known as: PROVENTIL Inhale 3 mLs into the lungs 3 (three) times daily as needed for shortness of breath. Inhale 81mL via nebulizer up to three times daily as needed.   aspirin 81 MG EC tablet Take 1 tablet by mouth daily.   diclofenac Sodium 1 % Gel Commonly known as: VOLTAREN Apply 1 application topically 4 (four) times daily as needed for pain. Apply one application topically up to four times daily for knee pain.   docusate sodium 100 MG  capsule Commonly known as: COLACE Take 100 mg by mouth daily.   dorzolamide 2 % ophthalmic solution Commonly known as: TRUSOPT Place 1 drop into the right eye 2 (two) times daily. Instill 1 drop in right eye 2 times daily.   latanoprost 0.005 % ophthalmic solution Commonly known as: XALATAN Place 1 drop into both eyes at bedtime.   metFORMIN 1000 MG tablet Commonly known as: GLUCOPHAGE Take 1,000 mg by mouth daily.   metoprolol succinate 25 MG 24 hr tablet Commonly known as: TOPROL-XL Take 25 mg by mouth daily.   triamterene-hydrochlorothiazide 37.5-25 MG tablet Commonly known as: MAXZIDE-25 Take 1 tablet by mouth daily.            History reviewed. No pertinent past medical history.  History reviewed. No pertinent surgical history.  Family History  Problem Relation Age of Onset   Diabetes Father    Diabetes Sister    Diabetes Brother    Thyroid disease Neg Hx     Social History:  reports that she has never smoked. She has never used smokeless tobacco. She reports previous alcohol use. No history on file for drug use.  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 1000mg  This was stopped because of relatively [name] blood sugars and her weight loss  She checks fasting blood sugars at home and these are usually below 100  Lab Results  Component Value Date   HGBA1C 6.1 12/05/2020   HGBA1C 6.1 12/05/2020   HGBA1C 6.2 (A) 08/02/2020   Lab Results  Component Value Date   CREATININE 1.04 01/03/2021      Examination:   BP 140/82 (BP Location: Left Arm, Patient Position: Sitting, Cuff Size: Normal)   Pulse 66   Ht 5\' 5"  (1.651 m)   Wt 197 lb (89.4 kg)   SpO2 98%   BMI 32.78 kg/m   Thyroid not clearly palpable on swallowing on the left and midline but not enlarged on the right Biceps reflexes are brisk No tremor of hands Hands not unusually warm   Assessment/Plan:    Hyperthyroidism, 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   She also has a multinodular goiter objectively  She was treated with I-131 in 05/2020 with about 21 mCi and again with 29 mCi on 02/23/2021  Her hyperthyroidism appears to have resolved with the second treatment Clinically she is doing well and her last thyroid levels were normal without methimazole Thyroid is not clearly palpable today and likely much smaller Appears euthyroid on exam  Thyroid function will be rechecked today Discussed possibility of long-term supplementation if she becomes hypothyroid  Reather Littler 05/25/2021, 10:21 AM    Note: This office note was prepared with Dragon voice recognition system technology. Any transcriptional errors that result from this process are unintentional.

## 2021-05-27 NOTE — Progress Notes (Signed)
Thyroid results are within normal range, no medication needed currently and keep follow-up for 2 months

## 2021-07-25 ENCOUNTER — Encounter: Payer: Self-pay | Admitting: Endocrinology

## 2021-07-25 ENCOUNTER — Other Ambulatory Visit: Payer: Self-pay

## 2021-07-25 ENCOUNTER — Ambulatory Visit (INDEPENDENT_AMBULATORY_CARE_PROVIDER_SITE_OTHER): Payer: Medicare PPO | Admitting: Endocrinology

## 2021-07-25 VITALS — BP 158/84 | HR 76 | Ht 65.0 in | Wt 193.4 lb

## 2021-07-25 DIAGNOSIS — E0590 Thyrotoxicosis, unspecified without thyrotoxic crisis or storm: Secondary | ICD-10-CM

## 2021-07-25 DIAGNOSIS — E669 Obesity, unspecified: Secondary | ICD-10-CM | POA: Diagnosis not present

## 2021-07-25 DIAGNOSIS — E1169 Type 2 diabetes mellitus with other specified complication: Secondary | ICD-10-CM | POA: Diagnosis not present

## 2021-07-25 LAB — BASIC METABOLIC PANEL
BUN: 26 mg/dL — ABNORMAL HIGH (ref 6–23)
CO2: 27 mEq/L (ref 19–32)
Calcium: 10.7 mg/dL — ABNORMAL HIGH (ref 8.4–10.5)
Chloride: 107 mEq/L (ref 96–112)
Creatinine, Ser: 0.94 mg/dL (ref 0.40–1.20)
GFR: 56.62 mL/min — ABNORMAL LOW (ref >60.00)
Glucose, Bld: 96 mg/dL (ref 70–99)
Potassium: 3.7 mEq/L (ref 3.5–5.1)
Sodium: 144 mEq/L (ref 135–145)

## 2021-07-25 LAB — T4, FREE: Free T4: 1.3 ng/dL (ref 0.60–1.60)

## 2021-07-25 LAB — HEMOGLOBIN A1C: Hgb A1c MFr Bld: 6 % (ref 4.6–6.5)

## 2021-07-25 LAB — T3, FREE: T3, Free: 4.3 pg/mL — ABNORMAL HIGH (ref 2.3–4.2)

## 2021-07-25 LAB — TSH: TSH: 0 u[IU]/mL — ABNORMAL LOW (ref 0.35–5.50)

## 2021-07-25 MED ORDER — METHIMAZOLE 5 MG PO TABS: 2.5000 mg | ORAL_TABLET | Freq: Every day | ORAL | 1 refills | Status: DC

## 2021-07-25 NOTE — Progress Notes (Signed)
Patient ID: Erica [name], female   DOB: 1941/03/28, 80 y.o.   MRN: [id number]                                                                                                              Reason for Appointment:  Hyperthyroidism, follow-up visit  Chief complaint: Follow-up of thyroid   History of Present Illness:   Background history: She says she has been diagnosed to have an overactive thyroid over 10 years ago Initially she had symptoms of palpitations, feeling moody, feeling excessively warm and sweaty, and fatigue.  The patient had lost unknown amount of weight also  He has been treated with methimazole in variable doses, presumably over 20 mg at 1 time For some time he has been taking only 5 mg daily She was followed by an endocrinologist at [facility] and was last seen there in 04/2019 and at that time was taking 10 mg methimazole  Apparently at that time her TSH had gone up to 10.1 and she was told to reduce the dose to 5 mg but did not have regular follow-up subsequently until she was seen by her PCP   RECENT HISTORY:  On her initial consultation she felt that she may have lost some weight and was having persistent tiredness She did not have any other symptoms suggestive of hyperthyroidism Her thyroid levels in 4/21 were normal but her thyrotropin receptor antibody was 21.7  She was treated with radioactive iodine on 05/26/2020 with 20.7 mCi I-131 sodium iodide As of 8/21 she became hyperthyroid again  Since she did not have a remission on methimazole she has been treated again with I-131 She had 29.1 mCi I-131 done on 02/23/2021  Thyroid levels had improved as of 3/22 and her methimazole was stopped after 2 weeks, previously on 20 mg She is currently off methimazole  She feels a little fatigued on some days but not consistently She says she is eating less meat and more vegetables and is losing a little weight but appetite may not be decreased  She has no consistent heat  or cold intolerance  No palpitations or shakiness  Thyroid levels on the last visit in June were normal with suppressed TSH as before  Labs pending  Wt Readings from Last 3 Encounters:  07/25/21 193 lb 6.4 oz (87.7 kg)  05/25/21 197 lb (89.4 kg)  04/11/21 196 lb 12.8 oz (89.3 kg)    Thyroid function tests as follows:     TSH done on 03/17/2020 was 4.65, free T4 = 0.85 and total T3 = 98  Lab Results  Component Value Date   FREET4 1.16 05/25/2021   FREET4 0.93 04/11/2021   FREET4 0.84 03/06/2021   T3FREE 3.5 05/25/2021   T3FREE 3.4 04/11/2021   T3FREE 3.0 03/06/2021   TSH 0.01 (L) 05/25/2021   TSH 0.03 (L) 04/11/2021   TSH <0.01 (L) 01/03/2021    Lab Results  Component Value Date   THYROTRECAB 118.00 (H) 10/23/2020   THYROTRECAB 21.70 (H) 04/11/2020  04/2020:24 hour radio iodine uptake calculated at 51%  Previous thyrotropin receptor antibody done in 11/2017 was 19.6 through [facility]  Evaluation done at [facility] in 2019:  Thyroid nodules: thyroid US confirmed the presence of 3 very large nodules (2xRt and 1xLT) which all meet criteria for Bx. NM RIU with >60% uptake and cold nodules. FNA with benign results   Allergies as of 07/25/2021   No Known Allergies      Medication List        Accurate as of July 25, 2021  9:45 AM. If you have any questions, ask your nurse or doctor.          albuterol 108 (90 Base) MCG/ACT inhaler Commonly known as: VENTOLIN HFA Inhale 2 puffs into the lungs 4 (four) times daily as needed for shortness of breath. Inhale up to 4 times daily as needed.   albuterol (2.5 MG/3ML) 0.083% nebulizer solution Commonly known as: PROVENTIL Inhale 3 mLs into the lungs 3 (three) times daily as needed for shortness of breath. Inhale 42mL via nebulizer up to three times daily as needed.   aspirin 81 MG EC tablet Take 1 tablet by mouth daily.   diclofenac Sodium 1 % Gel Commonly known as: VOLTAREN Apply 1 application topically 4 (four) times  daily as needed for pain. Apply one application topically up to four times daily for knee pain.   docusate sodium 100 MG capsule Commonly known as: COLACE Take 100 mg by mouth daily.   dorzolamide 2 % ophthalmic solution Commonly known as: TRUSOPT Place 1 drop into the right eye 2 (two) times daily. Instill 1 drop in right eye 2 times daily.   latanoprost 0.005 % ophthalmic solution Commonly known as: XALATAN Place 1 drop into both eyes at bedtime.   metFORMIN 1000 MG tablet Commonly known as: GLUCOPHAGE Take 1,000 mg by mouth daily.   metoprolol succinate 25 MG 24 hr tablet Commonly known as: TOPROL-XL Take 25 mg by mouth daily.   triamterene-hydrochlorothiazide 37.5-25 MG tablet Commonly known as: MAXZIDE-25 Take 1 tablet by mouth daily.            No past medical history on file.  No past surgical history on file.  Family History  Problem Relation Age of Onset   Diabetes Father    Diabetes Sister    Diabetes Brother    Thyroid disease Neg Hx     Social History:  reports that she has never smoked. She has never used smokeless tobacco. She reports previous alcohol use. No history on file for drug use.  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 1000mg  This was stopped because of relatively [name] blood sugars and her weight loss  She checks fasting blood sugars at home occasionally Does not have a recent A1c apparently  Lab Results  Component Value Date   HGBA1C 6.1 12/05/2020   HGBA1C 6.1 12/05/2020   HGBA1C 6.2 (A) 08/02/2020   Lab Results  Component Value Date   CREATININE 1.04 01/03/2021      Examination:   BP (!) 158/84   Pulse 76   Ht 5\' 5"  (1.651 m)   Wt 193 lb 6.4 oz (87.7 kg)   SpO2 99%   BMI 32.18 kg/m   Thyroid may be just palpable on swallowing on the right medial lobe, smooth and slightly firm  Not palpable on the left   Biceps reflexes are brisk No tremor  of hands Hands somewhat cool to touch   Assessment/Plan:   Hyperthyroidism, 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   She also has a multinodular goiter on exam  She was treated with I-131 in 05/2020 with about 21 mCi and again with 29 mCi on 02/23/2021  Currently not on methimazole Clinically she is doing well although has lost a little weight but has no objective signs of hyperthyroidism  She may still have some remnant of her multinodular goiter on the right side on exam  Thyroid function will be rechecked today  Discussed possibility of temporarily using methimazole if she starts getting hyperthyroid again, likely would not have her get I-131 again for at least 6 months  Reather Littler 07/25/2021, 9:45 AM    Note: This office note was prepared with Dragon voice recognition system technology. Any transcriptional errors that result from this process are unintentional.  Addendum: Free T3 is high and free T4 is relatively higher, will have her start methimazole 2.5 mg daily or 5 mg 3 days a week

## 2021-09-24 ENCOUNTER — Other Ambulatory Visit: Payer: Self-pay

## 2021-09-24 ENCOUNTER — Encounter: Payer: Self-pay | Admitting: Endocrinology

## 2021-09-24 ENCOUNTER — Ambulatory Visit (INDEPENDENT_AMBULATORY_CARE_PROVIDER_SITE_OTHER): Payer: Medicare PPO | Admitting: Endocrinology

## 2021-09-24 VITALS — BP 138/80 | HR 84 | Ht 65.0 in | Wt 199.6 lb

## 2021-09-24 DIAGNOSIS — E0590 Thyrotoxicosis, unspecified without thyrotoxic crisis or storm: Secondary | ICD-10-CM | POA: Diagnosis not present

## 2021-09-24 LAB — T3, FREE: T3, Free: 3.7 pg/mL (ref 2.3–4.2)

## 2021-09-24 LAB — TSH: TSH: 0.02 u[IU]/mL — ABNORMAL LOW (ref 0.35–5.50)

## 2021-09-24 LAB — T4, FREE: Free T4: 0.91 ng/dL (ref 0.60–1.60)

## 2021-09-24 NOTE — Progress Notes (Signed)
Patient ID: Erica [name], female   DOB: 1941/03/25, 80 y.o.   MRN: [id number]                                                                                                              Reason for Appointment:  Hyperthyroidism, follow-up visit  Chief complaint: Follow-up of thyroid   History of Present Illness:   Background history: She says she has been diagnosed to have an overactive thyroid over 10 years ago Initially she had symptoms of palpitations, feeling moody, feeling excessively warm and sweaty, and fatigue.  The patient had lost unknown amount of weight also  He has been treated with methimazole in variable doses, presumably over 20 mg at 1 time For some time he has been taking only 5 mg daily She was followed by an endocrinologist at [facility] and was last seen there in 04/2019 and at that time was taking 10 mg methimazole  Apparently at that time her TSH had gone up to 10.1 and she was told to reduce the dose to 5 mg but did not have regular follow-up subsequently until she was seen by her PCP   RECENT HISTORY:  On her initial consultation she felt that she may have lost some weight and was having persistent tiredness She did not have any other symptoms suggestive of hyperthyroidism Her thyroid levels in 4/21 were normal but her thyrotropin receptor antibody was 21.7  She was treated with radioactive iodine on 05/26/2020 with 20.7 mCi I-131 sodium iodide As of 8/21 she became hyperthyroid again  Since she did not have a remission on methimazole she has been treated again with I-131 She had 29.1 mCi I-131 done on 02/23/2021  Thyroid levels had improved as of 3/22 and her methimazole was stopped after 2 weeks, previously on 20 mg  She feels a little more colder than usual No unusual fatigue She had previously lost 4 pounds and now has gained back 6 pounds No palpitations or shakiness  Thyroid levels on the last visit in 8/22 showed the following   Free T3 is high  and free T4 is relatively higher, she was told to start methimazole 2.5 mg daily or 5 mg 3 days a week but she thinks she is taking methimazole 1 tablet every day  TSH still suppressed  Labs pending  Wt Readings from Last 3 Encounters:  09/24/21 199 lb 9.6 oz (90.5 kg)  07/25/21 193 lb 6.4 oz (87.7 kg)  05/25/21 197 lb (89.4 kg)    Thyroid function tests as follows:     TSH done on 03/17/2020 was 4.65, free T4 = 0.85 and total T3 = 98  Lab Results  Component Value Date   FREET4 1.30 07/25/2021   FREET4 1.16 05/25/2021   FREET4 0.93 04/11/2021   T3FREE 4.3 (H) 07/25/2021   T3FREE 3.5 05/25/2021   T3FREE 3.4 04/11/2021   TSH 0.00 Repeated and verified X2. (L) 07/25/2021   TSH 0.01 (L) 05/25/2021   TSH 0.03 (L) 04/11/2021    Lab  Results  Component Value Date   THYROTRECAB 118.00 (H) 10/23/2020   THYROTRECAB 21.70 (H) 04/11/2020    04/2020:24 hour radio iodine uptake calculated at 51%  Previous thyrotropin receptor antibody done in 11/2017 was 19.6 through [facility]  Evaluation done at [facility] in 2019:  Thyroid nodules: thyroid US confirmed the presence of 3 very large nodules (2xRt and 1xLT) which all meet criteria for Bx. NM RIU with >60% uptake and cold nodules. FNA with benign results   Allergies as of 09/24/2021   No Known Allergies      Medication List        Accurate as of September 24, 2021  1:09 PM. If you have any questions, ask your nurse or doctor.          albuterol 108 (90 Base) MCG/ACT inhaler Commonly known as: VENTOLIN HFA Inhale 2 puffs into the lungs 4 (four) times daily as needed for shortness of breath. Inhale up to 4 times daily as needed.   albuterol (2.5 MG/3ML) 0.083% nebulizer solution Commonly known as: PROVENTIL Inhale 3 mLs into the lungs 3 (three) times daily as needed for shortness of breath. Inhale 49mL via nebulizer up to three times daily as needed.   aspirin 81 MG EC tablet Take 1 tablet by mouth daily.   diclofenac Sodium 1 %  Gel Commonly known as: VOLTAREN Apply 1 application topically 4 (four) times daily as needed for pain. Apply one application topically up to four times daily for knee pain.   docusate sodium 100 MG capsule Commonly known as: COLACE Take 100 mg by mouth daily.   dorzolamide 2 % ophthalmic solution Commonly known as: TRUSOPT Place 1 drop into the right eye 2 (two) times daily. Instill 1 drop in right eye 2 times daily.   latanoprost 0.005 % ophthalmic solution Commonly known as: XALATAN Place 1 drop into both eyes at bedtime.   methimazole 5 MG tablet Commonly known as: TAPAZOLE Take 0.5 tablets (2.5 mg total) by mouth daily.   metoprolol succinate 25 MG 24 hr tablet Commonly known as: TOPROL-XL Take 25 mg by mouth daily.   triamterene-hydrochlorothiazide 37.5-25 MG tablet Commonly known as: MAXZIDE-25 Take 1 tablet by mouth daily.            No past medical history on file.  No past surgical history on file.  Family History  Problem Relation Age of Onset   Diabetes Father    Diabetes Sister    Diabetes Brother    Thyroid disease Neg Hx     Social History:  reports that she has never smoked. She has never used smokeless tobacco. She reports that she does not currently use alcohol. No history on file for drug use.  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 500mg  This was recommended to be stopped because of relatively [name] blood sugars and her weight loss but she still is taking 1 tablet daily  She checks fasting blood sugars at home occasionally, recently 105-110 A1c about the same  Lab Results  Component Value Date   HGBA1C 6.0 07/25/2021   HGBA1C 6.1 12/05/2020   HGBA1C 6.1 12/05/2020   Lab Results  Component Value Date   CREATININE 0.94 07/25/2021      Examination:   BP 138/80   Pulse 84   Ht 5\' 5"  (1.651 m)   Wt 199 lb 9.6 oz (90.5 kg)   SpO2 99%   BMI  33.22 kg/m        Assessment/Plan:   Hyperthyroidism, 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   Also has history of multinodular goiter on exam  She was treated with I-131 in 05/2020 with about 21 mCi and again with 29 mCi on 02/23/2021  Currently back on methimazole because of mild subclinical hyperthyroidism noticed on her labs in August She may have lost some weight previously because of the higher levels and now has gained back 6 pounds However she is taking 5 mg daily instead of 2.5 mg as directed No specific symptoms suggestive of hypothyroidism  Thyroid function will be rechecked today  Discussed possibility of needing methimazole long-term since she has not had a remission with 2 radioactive iodine treatments  She is comfortable with regular follow-up here  Patient will be contacted by MyChart when labs are available  Reather Littler 09/24/2021, 1:09 PM    Note: This office note was prepared with Dragon voice recognition system technology. Any transcriptional errors that result from this process are unintentional.

## 2021-11-27 ENCOUNTER — Ambulatory Visit: Payer: Medicare PPO | Admitting: Endocrinology

## 2021-11-27 ENCOUNTER — Encounter: Payer: Self-pay | Admitting: Endocrinology

## 2021-11-27 ENCOUNTER — Other Ambulatory Visit: Payer: Self-pay

## 2021-11-27 VITALS — BP 164/90 | HR 88 | Ht 65.0 in | Wt 192.0 lb

## 2021-11-27 DIAGNOSIS — E1169 Type 2 diabetes mellitus with other specified complication: Secondary | ICD-10-CM | POA: Diagnosis not present

## 2021-11-27 DIAGNOSIS — E669 Obesity, unspecified: Secondary | ICD-10-CM | POA: Diagnosis not present

## 2021-11-27 DIAGNOSIS — E0590 Thyrotoxicosis, unspecified without thyrotoxic crisis or storm: Secondary | ICD-10-CM | POA: Diagnosis not present

## 2021-11-27 DIAGNOSIS — I10 Essential (primary) hypertension: Secondary | ICD-10-CM

## 2021-11-27 LAB — T3, FREE: T3, Free: 3.5 pg/mL (ref 2.3–4.2)

## 2021-11-27 LAB — T4, FREE: Free T4: 0.9 ng/dL (ref 0.60–1.60)

## 2021-11-27 LAB — HEMOGLOBIN A1C: Hgb A1c MFr Bld: 6.1 % (ref 4.6–6.5)

## 2021-11-27 LAB — GLUCOSE, RANDOM: Glucose, Bld: 97 mg/dL (ref 70–99)

## 2021-11-27 LAB — TSH: TSH: 0.02 u[IU]/mL — ABNORMAL LOW (ref 0.35–5.50)

## 2021-11-27 MED ORDER — METHIMAZOLE 5 MG PO TABS: 5.0000 mg | ORAL_TABLET | Freq: Every day | ORAL | 1 refills | Status: DC

## 2021-11-27 NOTE — Progress Notes (Signed)
Patient ID: Erica [name], female   DOB: 10-28-1941, 80 y.o.   MRN: [id number]                                                                                                              Reason for Appointment:  Hyperthyroidism, follow-up visit  Chief complaint: Follow-up of thyroid   History of Present Illness:   Background history: She says she has been diagnosed to have an overactive thyroid over 10 years ago Initially she had symptoms of palpitations, feeling moody, feeling excessively warm and sweaty, and fatigue.  The patient had lost unknown amount of weight also  He has been treated with methimazole in variable doses, presumably over 20 mg at 1 time For some time he has been taking only 5 mg daily She was followed by an endocrinologist at [facility] and was last seen there in 04/2019 and at that time was taking 10 mg methimazole  Apparently at that time her TSH had gone up to 10.1 and she was told to reduce the dose to 5 mg but did not have regular follow-up subsequently until she was seen by her PCP   RECENT HISTORY:  On her initial consultation she felt that she may have lost some weight and was having persistent tiredness She did not have any other symptoms suggestive of hyperthyroidism Her thyroid levels in 4/21 were normal but her thyrotropin receptor antibody was 21.7  She was treated with radioactive iodine on 05/26/2020 with 20.7 mCi I-131 sodium iodide As of 8/21 she became hyperthyroid again  Since she did not have a remission on methimazole she has been treated again with I-131 She had 29.1 mCi I-131 done on 02/23/2021  Thyroid levels had improved as of 3/22 and her methimazole was stopped after 2 weeks, previously on 20 mg However she has been back on methimazole since 8/22  She feels overall about the same No heat or cold intolerance or shakiness, no palpitations No unusual fatigue Weight has leveled off She continues to take 5 mg methimazole  daily  Thyroid levels done by PCP in November showed total T4 of 7.7   TSH still suppressed  Labs pending  Wt Readings from Last 3 Encounters:  11/27/21 192 lb (87.1 kg)  09/24/21 199 lb 9.6 oz (90.5 kg)  07/25/21 193 lb 6.4 oz (87.7 kg)    Thyroid function tests as follows:     TSH done on 03/17/2020 was 4.65, free T4 = 0.85 and total T3 = 98  Lab Results  Component Value Date   FREET4 0.91 09/24/2021   FREET4 1.30 07/25/2021   FREET4 1.16 05/25/2021   T3FREE 3.7 09/24/2021   T3FREE 4.3 (H) 07/25/2021   T3FREE 3.5 05/25/2021   TSH 0.02 (L) 09/24/2021   TSH 0.00 Repeated and verified X2. (L) 07/25/2021   TSH 0.01 (L) 05/25/2021    Lab Results  Component Value Date   THYROTRECAB 118.00 (H) 10/23/2020   THYROTRECAB 21.70 (H) 04/11/2020    04/8250:89 hour radio iodine uptake calculated  at 51%  Previous thyrotropin receptor antibody done in 11/2017 was 19.6 through [facility]  Evaluation done at [facility] in 2019:  Thyroid nodules: thyroid US confirmed the presence of 3 very large nodules (2xRt and 1xLT) which all meet criteria for Bx. NM RIU with >60% uptake and cold nodules. FNA with benign results   Allergies as of 11/27/2021   No Known Allergies      Medication List        Accurate as of November 27, 2021  9:26 AM. If you have any questions, ask your nurse or doctor.          albuterol 108 (90 Base) MCG/ACT inhaler Commonly known as: VENTOLIN HFA Inhale 2 puffs into the lungs 4 (four) times daily as needed for shortness of breath. Inhale up to 4 times daily as needed.   albuterol (2.5 MG/3ML) 0.083% nebulizer solution Commonly known as: PROVENTIL Inhale 3 mLs into the lungs 3 (three) times daily as needed for shortness of breath. Inhale 25mL via nebulizer up to three times daily as needed.   aspirin 81 MG EC tablet Take 1 tablet by mouth daily.   diclofenac Sodium 1 % Gel Commonly known as: VOLTAREN Apply 1 application topically 4 (four) times daily as  needed for pain. Apply one application topically up to four times daily for knee pain.   docusate sodium 100 MG capsule Commonly known as: COLACE Take 100 mg by mouth daily.   dorzolamide 2 % ophthalmic solution Commonly known as: TRUSOPT Place 1 drop into the right eye 2 (two) times daily. Instill 1 drop in right eye 2 times daily.   latanoprost 0.005 % ophthalmic solution Commonly known as: XALATAN Place 1 drop into both eyes at bedtime.   methimazole 5 MG tablet Commonly known as: TAPAZOLE Take 0.5 tablets (2.5 mg total) by mouth daily.   metoprolol succinate 25 MG 24 hr tablet Commonly known as: TOPROL-XL Take 25 mg by mouth daily.   triamterene-hydrochlorothiazide 37.5-25 MG tablet Commonly known as: MAXZIDE-25 Take 1 tablet by mouth daily.            No past medical history on file.  No past surgical history on file.  Family History  Problem Relation Age of Onset   Diabetes Father    Diabetes Sister    Diabetes Brother    Thyroid disease Neg Hx     Social History:  reports that she has never smoked. She has never used smokeless tobacco. She reports that she does not currently use alcohol. No history on file for drug use.  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 500mg  This was recommended to be stopped because of relatively [name] blood sugars and her weight loss but she still is taking 1 tablet daily  She checks fasting blood sugars at home and occasionally at other times and highest blood sugar has been in the 140 She has been able to maintain her weight down  A1c not available recently and will be checked  Lab Results  Component Value Date   HGBA1C 6.0 07/25/2021   HGBA1C 6.1 12/05/2020   HGBA1C 6.1 12/05/2020   Lab Results  Component Value Date   CREATININE 0.94 07/25/2021      Examination:   BP (!) 164/90    Pulse 88    Ht 5\' 5"  (1.651 m)    Wt 192 lb (87.1 kg)     SpO2 99%  BMI 31.95 kg/m       Assessment/Plan:   Hyperthyroidism, 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   Also has history of multinodular goiter on exam  She was treated with I-131 in 05/2020 with about 21 mCi and again with 29 mCi on 02/23/2021  She appears to be euthyroid on methimazole 5 mg daily and doing well subjectively also Also has minimal thyroid enlargement which has receded since her I-131 treatment   Thyroid labs will be rechecked today  Likely will need methimazole long-term since she has not had a remission with 2 radioactive iodine treatments  If no change made we will see her in 3 months   Patient will be contacted by MyChart when labs are available  HYPERTENSION: Blood pressure is unusually high rechecked twice and she needs to call her PCP for follow-up  Reather Littler 11/27/2021, 9:26 AM    Note: This office note was prepared with Dragon voice recognition system technology. Any transcriptional errors that result from this process are unintentional.

## 2021-11-27 NOTE — Progress Notes (Signed)
Please call to let patient know that the lab results are ok , stay on 5mg  Methimazole, send my chart message

## 2021-11-28 NOTE — Progress Notes (Signed)
Patient infomed of lab results, expressed understanding. Mychart message sent as well

## 2022-02-27 ENCOUNTER — Encounter: Payer: Self-pay | Admitting: Endocrinology

## 2022-02-27 ENCOUNTER — Other Ambulatory Visit: Payer: Self-pay

## 2022-02-27 ENCOUNTER — Ambulatory Visit (INDEPENDENT_AMBULATORY_CARE_PROVIDER_SITE_OTHER): Payer: Medicare Other | Admitting: Endocrinology

## 2022-02-27 VITALS — BP 138/92 | HR 69 | Ht 65.0 in | Wt 205.4 lb

## 2022-02-27 DIAGNOSIS — E1169 Type 2 diabetes mellitus with other specified complication: Secondary | ICD-10-CM | POA: Diagnosis not present

## 2022-02-27 DIAGNOSIS — E0590 Thyrotoxicosis, unspecified without thyrotoxic crisis or storm: Secondary | ICD-10-CM

## 2022-02-27 DIAGNOSIS — E669 Obesity, unspecified: Secondary | ICD-10-CM | POA: Diagnosis not present

## 2022-02-27 LAB — GLUCOSE, RANDOM: Glucose, Bld: 87 mg/dL (ref 70–99)

## 2022-02-27 LAB — T4, FREE: Free T4: 0.68 ng/dL (ref 0.60–1.60)

## 2022-02-27 LAB — TSH: TSH: 3.74 u[IU]/mL (ref 0.35–5.50)

## 2022-02-27 NOTE — Progress Notes (Signed)
? ? ?Patient ID: Erica [name], female   DOB: 08-04-1941, 81 y.o.   MRN: [id number] ?                                                                                              ? ?             ? ?Reason for Appointment:  Hyperthyroidism, follow-up visit ? ?Chief complaint: Follow-up of thyroid ? ? History of Present Illness:  ? ?Background history: ?She says she has been diagnosed to have an overactive thyroid over 10 years ago ?Initially she had symptoms of palpitations, feeling moody, feeling excessively warm and sweaty, and fatigue.  ?The patient had lost unknown amount of weight also ? ?He has been treated with methimazole in variable doses, presumably over 20 mg at 1 time ?For some time he has been taking only 5 mg daily ?She was followed by an endocrinologist at [facility] and was last seen there in 04/2019 and at that time was taking 10 mg methimazole ? ?Apparently at that time her TSH had gone up to 10.1 and she was told to reduce the dose to 5 mg but did not have regular follow-up subsequently until she was seen by her PCP  ? ?RECENT HISTORY: ? ?On her initial consultation she had some weight loss and was having persistent tiredness ?She did not have any other symptoms suggestive of hyperthyroidism ?Her thyroid levels in 4/21 were normal but her thyrotropin receptor antibody was 21.7 ? ?She was treated with radioactive iodine on 05/26/2020 with 20.7 mCi I-131 sodium iodide ?As of 8/21 she became hyperthyroid again ? ?Since she did not have a remission on methimazole she has been treated again with I-131 ?She had 29.1 mCi I-131 done on 02/23/2021 ? ?Thyroid levels had improved as of 3/22 and her methimazole was stopped after 2 weeks, previously on 20 mg ?However she has been back on methimazole since 8/22 because of recurring hyperthyroidism ? ?Since her last visit in 12/22 she has gained 13 pounds and she is not clear why  ?He does not think her appetite is changed ?Does not think she feels any more tired than  usual ?No hair loss or cold intolerance ? ?She continues to take 5 mg methimazole daily ? ? ?TSH has been still suppressed, last free T4 consistently normal ? ?Labs pending ? ?Wt Readings from Last 3 Encounters:  ?02/27/22 205 lb 6.4 oz (93.2 kg)  ?11/27/21 192 lb (87.1 kg)  ?09/24/21 199 lb 9.6 oz (90.5 kg)  ? ? ?Thyroid function tests as follows:    ? ?TSH done on 03/17/2020 was 4.65, free T4 = 0.85 and total T3 = 98 ? ?Lab Results  ?Component Value Date  ? FREET4 0.90 11/27/2021  ? FREET4 0.91 09/24/2021  ? FREET4 1.30 07/25/2021  ? T3FREE 3.5 11/27/2021  ? T3FREE 3.7 09/24/2021  ? T3FREE 4.3 (H) 07/25/2021  ? TSH 0.02 (L) 11/27/2021  ? TSH 0.02 (L) 09/24/2021  ? TSH 0.00 Repeated and verified X2. (L) 07/25/2021  ? ? ?Lab Results  ?Component Value Date  ? THYROTRECAB 118.00 (H) 10/23/2020  ?  THYROTRECAB 21.70 (H) 04/11/2020  ?  ?04/2020:24 hour radio iodine uptake calculated at 51% ? ?Previous thyrotropin receptor antibody done in 11/2017 was 19.6 through [facility] ? ?Evaluation done at [facility] in 2019: ? ?Thyroid nodules: thyroid US confirmed the presence of 3 very large nodules (2xRt and 1xLT) which all meet criteria for Bx. NM RIU with >60% uptake and cold nodules. FNA with benign results ? ? ?Allergies as of 02/27/2022   ?No Known Allergies ?  ? ?  ?Medication List  ?  ? ?  ? Accurate as of February 27, 2022  9:26 AM. If you have any questions, ask your nurse or doctor.  ?  ?  ? ?  ? ?albuterol 108 (90 Base) MCG/ACT inhaler ?Commonly known as: VENTOLIN HFA ?Inhale 2 puffs into the lungs 4 (four) times daily as needed for shortness of breath. Inhale up to 4 times daily as needed. ?  ?albuterol (2.5 MG/3ML) 0.083% nebulizer solution ?Commonly known as: PROVENTIL ?Inhale 3 mLs into the lungs 3 (three) times daily as needed for shortness of breath. Inhale 3mL via nebulizer up to three times daily as needed. ?  ?aspirin 81 MG EC tablet ?Take 1 tablet by mouth daily. ?  ?diclofenac Sodium 1 % Gel ?Commonly known as:  VOLTAREN ?Apply 1 application topically 4 (four) times daily as needed for pain. Apply one application topically up to four times daily for knee pain. ?  ?docusate sodium 100 MG capsule ?Commonly known as: COLACE ?Take 100 mg by mouth daily. ?  ?dorzolamide 2 % ophthalmic solution ?Commonly known as: TRUSOPT ?Place 1 drop into the right eye 2 (two) times daily. Instill 1 drop in right eye 2 times daily. ?  ?latanoprost 0.005 % ophthalmic solution ?Commonly known as: XALATAN ?Place 1 drop into both eyes at bedtime. ?  ?methimazole 5 MG tablet ?Commonly known as: TAPAZOLE ?Take 1 tablet (5 mg total) by mouth daily. ?  ?metoprolol succinate 25 MG 24 hr tablet ?Commonly known as: TOPROL-XL ?Take 25 mg by mouth daily. ?  ?triamterene-hydrochlorothiazide 37.5-25 MG tablet ?Commonly known as: MAXZIDE-25 ?Take 1 tablet by mouth daily. ?  ? ?  ?     ? ?No past medical history on file. ? ?No past surgical history on file. ? ?Family History  ?Problem Relation Age of Onset  ? Diabetes Father   ? Diabetes Sister   ? Diabetes Brother   ? Thyroid disease Neg Hx   ? ? ?Social History:  reports that she has never smoked. She has never used smokeless tobacco. She reports that she does not currently use alcohol. No history on file for drug use. ? ?Allergies: No Known Allergies ? ? ?Review of Systems ? 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 ?She checks fasting blood sugars at home, mostly in the morning, maximum 146 ? ? ?A1c: ? ?Lab Results  ?Component Value Date  ? HGBA1C 6.1 11/27/2021  ? HGBA1C 6.0 07/25/2021  ? HGBA1C 6.1 12/05/2020  ? HGBA1C 6.1 12/05/2020  ? ?Lab Results  ?Component Value Date  ? CREATININE 0.94 07/25/2021  ? ? ? ? Examination: ?  ?BP (!) 138/92 (BP Location: Left Arm, Patient Position: Sitting, Cuff Size: Normal)   Pulse 69   Ht 5\' 5"  (1.651 m)   Wt 205 lb 6.4 oz (93.2 kg)   SpO2 99%   BMI 34.18 kg/m?  ? ?Thyroid not clearly palpable on the right and about 1-1/2 times  normal  in the left, slightly firm and nodular ? ? ? Assessment/Plan: ? ? 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 ?Also has history of multinodular goiter on exam ? ?She was treated with I-131 in 05/2020 with about 21 mCi and again with 29 mCi on 02/23/2021 ? ?She has had recurrent and persistent hyperthyroidism ?Currently although she has gained a lot of weight she has no new symptoms ?Exam indicates she is looking euthyroid  ?Has been continued on methimazole 5 mg daily  ? ? ?Thyroid labs will be rechecked today ? ?Likely will need methimazole long-term since she has not had a remission with 2 radioactive iodine treatments  ? ?If no change made we will see her in 3 months ? ? ?Patient will be contacted by MyChart when labs are available ? ?She will contact her PCP to make sure she is getting her A1c periodically ? ?Reather Littler ?02/27/2022, 9:26 AM  ? ? ?Note: This office note was prepared with Insurance underwriter. Any transcriptional errors that result from this process are unintentional. ? ?  ?

## 2022-02-27 NOTE — Patient Instructions (Signed)
Check blood sugars on waking up 2 days a week ? ?Also check blood sugars about 2 hours after meals and do this after different meals by rotation ? ?Recommended blood sugar levels on waking up are 90-130 and about 2 hours after meal is 130-160 ?  ? ?

## 2022-03-04 ENCOUNTER — Telehealth: Payer: Self-pay

## 2022-03-04 NOTE — Telephone Encounter (Signed)
Patient called in requesting her results from her visit on 02/27/2022. Patient is just wanting a call back with that information  ? ? ? ?Please call and advise  ?

## 2022-03-22 ENCOUNTER — Other Ambulatory Visit: Payer: Self-pay | Admitting: Endocrinology

## 2022-11-20 LAB — LAB REPORT - SCANNED
A1c: 5.8
EGFR: 67
Microalb Creat Ratio: 7
